# Patient Record
Sex: Female | Born: 1990 | Race: Black or African American | Hispanic: No | Marital: Single | Smoking: Former smoker
Health system: Southern US, Community
[De-identification: ages and names within clinical notes are randomized; demographics above are authoritative.]

## PROBLEM LIST (undated history)

## (undated) ENCOUNTER — Emergency Department (HOSPITAL_BASED_OUTPATIENT_CLINIC_OR_DEPARTMENT_OTHER): Admission: EM | Payer: Medicaid Other | Source: Home / Self Care

## (undated) DIAGNOSIS — R12 Heartburn: Secondary | ICD-10-CM

## (undated) DIAGNOSIS — R42 Dizziness and giddiness: Secondary | ICD-10-CM

## (undated) DIAGNOSIS — R002 Palpitations: Secondary | ICD-10-CM

## (undated) DIAGNOSIS — J45909 Unspecified asthma, uncomplicated: Secondary | ICD-10-CM

## (undated) HISTORY — DX: Heartburn: R12

## (undated) HISTORY — PX: NO PAST SURGERIES: SHX2092

## (undated) HISTORY — DX: Dizziness and giddiness: R42

## (undated) HISTORY — DX: Palpitations: R00.2

---
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
## 2013-06-10 ENCOUNTER — Emergency Department (INDEPENDENT_AMBULATORY_CARE_PROVIDER_SITE_OTHER): Payer: Medicaid Other

## 2013-06-10 ENCOUNTER — Encounter (HOSPITAL_COMMUNITY): Payer: Self-pay | Admitting: Emergency Medicine

## 2013-06-10 ENCOUNTER — Emergency Department (HOSPITAL_COMMUNITY)
Admission: EM | Admit: 2013-06-10 | Discharge: 2013-06-10 | Disposition: A | Discharge status: Home / Self Care | Payer: Medicaid Other | Source: Home / Self Care | Attending: Emergency Medicine | Admitting: Emergency Medicine

## 2013-06-10 DIAGNOSIS — J111 Influenza due to unidentified influenza virus with other respiratory manifestations: Secondary | ICD-10-CM

## 2013-06-10 HISTORY — DX: Unspecified asthma, uncomplicated: J45.909

## 2013-06-10 LAB — POCT RAPID STREP A: Streptococcus, Group A Screen (Direct): NEGATIVE

## 2013-06-10 MED ORDER — IBUPROFEN 800 MG PO TABS
800.0000 mg | ORAL_TABLET | Freq: Once | ORAL | Status: AC
  Administered 2013-06-10: 800 mg via ORAL

## 2013-06-10 MED ORDER — PREDNISONE 20 MG PO TABS: 20.0000 mg | ORAL_TABLET | Freq: Two times a day (BID) | ORAL | Status: DC

## 2013-06-10 MED ORDER — OSELTAMIVIR PHOSPHATE 75 MG PO CAPS: 75.0000 mg | ORAL_CAPSULE | Freq: Two times a day (BID) | ORAL | Status: DC

## 2013-06-10 MED ORDER — TRAMADOL HCL 50 MG PO TABS: 100.0000 mg | ORAL_TABLET | Freq: Three times a day (TID) | ORAL | Status: DC | PRN

## 2013-06-10 MED ORDER — ALBUTEROL SULFATE HFA 108 (90 BASE) MCG/ACT IN AERS: 2.0000 | INHALATION_SPRAY | RESPIRATORY_TRACT | Status: DC | PRN

## 2013-06-10 MED ORDER — IBUPROFEN 800 MG PO TABS
ORAL_TABLET | ORAL | Status: AC
  Filled 2013-06-10: qty 1

## 2013-06-10 NOTE — ED Notes (Signed)
Nasal congestion greenish brown, cough, bloody drainage from nose with nasal congestion, sore throat, chills and sweats but no fever.  Chest hurts when she coughs and also c/o headache in her forehead. Symptoms onset Sunday night.

## 2013-06-10 NOTE — ED Notes (Signed)
Pt. requested something for her headache.  Order obtained from Dr. Lorenz Coaster.

## 2013-06-10 NOTE — ED Provider Notes (Signed)
Chief Complaint   No chief complaint on file.   History of Present Illness   Gail Robinson is a 22 year old female who has had a four-day history of nasal congestion with yellowish, bloody drainage, headache, ear congestion, cough with brown, blood-tinged sputum, chest tightness, wheezing, chest pain when she coughs, chills, sweats, myalgias, and sore throat. She has vomited once. She denies any fever. She has a history of asthma, but does not have an inhaler at home. She was exposed to her boss who had a flulike illness.  Review of Systems   Other than as noted above, the patient denies any of the following symptoms: Systemic:  No fevers, chills, sweats, or myalgias. Eye:  No redness or discharge. ENT:  No ear pain, headache, nasal congestion, drainage, sinus pressure, or sore throat. Neck:  No neck pain, stiffness, or swollen glands. Lungs:  No cough, sputum production, hemoptysis, wheezing, chest tightness, shortness of breath or chest pain. GI:  No abdominal pain, nausea, vomiting or diarrhea.  PMFSH   Past medical history, family history, social history, meds, and allergies were reviewed. She takes birth control pills. She is otherwise healthy.  Physical exam   Vital signs:  BP 150/93  Pulse 78  Temp(Src) 98.7 F (37.1 C) (Oral)  Resp 16  SpO2 97%  LMP 01/15/2013 General:  Alert and oriented.  In no distress.  Skin warm and dry. Eye:  No conjunctival injection or drainage. Lids were normal. ENT:  TMs and canals were normal, without erythema or inflammation.  Nasal mucosa was clear and uncongested, without drainage.  Mucous membranes were moist.  Pharynx was clear with no exudate or drainage.  There were no oral ulcerations or lesions. Neck:  Supple, no adenopathy, tenderness or mass. Lungs:  No respiratory distress.  Lungs were clear to auscultation, without wheezes, rales or rhonchi.  Breath sounds were clear and equal bilaterally.  Heart:  Regular rhythm, without  gallops, murmers or rubs. Skin:  Clear, warm, and dry, without rash or lesions.  Labs   Results for orders placed during the hospital encounter of 06/10/13  POCT RAPID STREP A ([facility] ONLY)      Result Value Range   Streptococcus, Group A Screen (Direct) NEGATIVE  NEGATIVE    Radiology   Dg Chest 2 View  06/10/2013   CLINICAL DATA:  Cough.  Hemoptysis.  Asthma.  EXAM: CHEST  2 VIEW  COMPARISON:  None.  FINDINGS: The heart size and mediastinal contours are within normal limits. Both lungs are clear. Mild pulmonary hyperinflation is demonstrated. Mild thoracic dextroscoliosis also seen.  IMPRESSION: No active cardiopulmonary disease.   Electronically Signed   By: Myles Rosenthal M.D.   On: 06/10/2013 18:31   Assessment     The encounter diagnosis was Influenza-like illness.  She is at risk for a flareup of her asthma, so will treat with albuterol and a short burst of prednisone.  Plan    1.  Meds:  The following meds were prescribed:   New Prescriptions   ALBUTEROL (PROVENTIL HFA;VENTOLIN HFA) 108 (90 BASE) MCG/ACT INHALER    Inhale 2 puffs into the lungs every 4 (four) hours as needed for wheezing or shortness of breath.   OSELTAMIVIR (TAMIFLU) 75 MG CAPSULE    Take 1 capsule (75 mg total) by mouth every 12 (twelve) hours.   PREDNISONE (DELTASONE) 20 MG TABLET    Take 1 tablet (20 mg total) by mouth 2 (two) times daily.   TRAMADOL Janean Sark)  50 MG TABLET    Take 2 tablets (100 mg total) by mouth every 8 (eight) hours as needed.    2.  Patient Education/Counseling:  The patient was given appropriate handouts, self care instructions, and instructed in symptomatic relief.  Instructed to get extra fluids, rest, and use a cool mist vaporizer. Discussed salt and sodium intake and avoidance of decongestants since her blood pressure was mildly elevated today. Suggested she see her primary care physician for recheck on this.  3.  Follow up:  The patient was told to follow up here if no better  in 3 to 4 days, or sooner if becoming worse in any way, and given some red flag symptoms such as increasing fever, difficulty breathing, chest pain, or persistent vomiting which would prompt immediate return.  Follow up here as needed.      Reuben Likes, MD 06/10/13 [phone]

## 2013-06-11 NOTE — L&D Delivery Note (Signed)
Delivery Note At 1:38 AM a viable and healthy female was delivered via Vaginal, Spontaneous Delivery (Presentation: left occiput anterior).  APGAR:8,9; weight- pending. Placenta status:intact - Tomasa BlaseSchultz.  Cord: 3 vessel with the following complications: none.   Anesthesia: Epidural  Episiotomy: none  Lacerations: none   Suture Repair: N/A Est. Blood Loss (mL): 250  Mom and baby doing well, bonding. Aunt at bedside. Mom to postpartum.  Baby to Couplet care / Skin to Skin.  ADAMS,SHNIQUAL SHWON 05/10/2014, 1:55 AM   Delivery attended by me also Agree with note Aviva SignsMarie L Lyriq Finerty, CNM

## 2013-06-12 LAB — CULTURE, GROUP A STREP

## 2013-08-05 ENCOUNTER — Encounter (HOSPITAL_COMMUNITY): Payer: Self-pay | Admitting: Emergency Medicine

## 2013-08-05 ENCOUNTER — Emergency Department (HOSPITAL_COMMUNITY)
Admission: EM | Admit: 2013-08-05 | Discharge: 2013-08-06 | Disposition: A | Discharge status: Home / Self Care | Payer: Medicaid Other | Attending: Emergency Medicine | Admitting: Emergency Medicine

## 2013-08-05 DIAGNOSIS — N898 Other specified noninflammatory disorders of vagina: Secondary | ICD-10-CM | POA: Insufficient documentation

## 2013-08-05 DIAGNOSIS — IMO0002 Reserved for concepts with insufficient information to code with codable children: Secondary | ICD-10-CM | POA: Insufficient documentation

## 2013-08-05 DIAGNOSIS — F17200 Nicotine dependence, unspecified, uncomplicated: Secondary | ICD-10-CM | POA: Insufficient documentation

## 2013-08-05 DIAGNOSIS — Z2089 Contact with and (suspected) exposure to other communicable diseases: Secondary | ICD-10-CM | POA: Insufficient documentation

## 2013-08-05 DIAGNOSIS — Z20828 Contact with and (suspected) exposure to other viral communicable diseases: Secondary | ICD-10-CM

## 2013-08-05 DIAGNOSIS — Z79899 Other long term (current) drug therapy: Secondary | ICD-10-CM | POA: Insufficient documentation

## 2013-08-05 DIAGNOSIS — J45909 Unspecified asthma, uncomplicated: Secondary | ICD-10-CM | POA: Insufficient documentation

## 2013-08-05 NOTE — ED Notes (Signed)
PT wants to be tested for herpes; no symptoms at this time.

## 2013-08-05 NOTE — ED Notes (Signed)
Pt states that she found out today she may have been exposed herpes.

## 2013-08-06 LAB — RPR: RPR Ser Ql: NONREACTIVE

## 2013-08-06 LAB — WET PREP, GENITAL
Trich, Wet Prep: NONE SEEN
Yeast Wet Prep HPF POC: NONE SEEN

## 2013-08-06 LAB — HIV ANTIBODY (ROUTINE TESTING W REFLEX): HIV: NONREACTIVE

## 2013-08-06 NOTE — ED Provider Notes (Signed)
Medical screening examination/treatment/procedure(s) were performed by non-physician practitioner and as supervising physician I was immediately available for consultation/collaboration.  EKG Interpretation   None        Shon Batonourtney F Horton, MD 08/06/13 [phone]

## 2013-08-06 NOTE — ED Provider Notes (Signed)
CSN: [id number]     Arrival date & time 08/05/13  2211 History   First MD Initiated Contact with Patient 08/06/13 0016     Chief Complaint  Patient presents with  . Exposure to STD     (Consider location/radiation/quality/duration/timing/severity/associated sxs/prior Treatment) Patient is a 23 y.o. female presenting with STD exposure.  Exposure to STD   23 yo female presents to ED with request to be tested for Herpes. Patient states her boyfriend had contact with another female that was diagnosed with Herpes about 3 weeks ago. Patient states she last has sex with boyfriend 2 weeks ago. Denies any current symptoms, rash, fever/chills, cough, HA, CP, SOB, vaginal discharge. Dysuria, hematuria, dyspareunia, vaginal bleeding.  Denies any PMH. Patient currently on OCP. Denies any IVDU.   Past Medical History  Diagnosis Date  . Asthma     childhood   History reviewed. No pertinent past surgical history. History reviewed. No pertinent family history. History  Substance Use Topics  . Smoking status: Current Every Day Smoker -- 0.30 packs/day  . Smokeless tobacco: Never Used  . Alcohol Use: No   OB History   Grav Para Term Preterm Abortions TAB SAB Ect Mult Living                 Review of Systems  All other systems reviewed and are negative.      Allergies  Review of patient's allergies indicates no known allergies.  Home Medications   Current Outpatient Rx  Name  Route  Sig  Dispense  Refill  . albuterol (PROVENTIL HFA;VENTOLIN HFA) 108 (90 BASE) MCG/ACT inhaler   Inhalation   Inhale 2 puffs into the lungs every 4 (four) hours as needed for wheezing or shortness of breath.   1 Inhaler   12   . oseltamivir (TAMIFLU) 75 MG capsule   Oral   Take 1 capsule (75 mg total) by mouth every 12 (twelve) hours.   10 capsule   0   . predniSONE (DELTASONE) 20 MG tablet   Oral   Take 1 tablet (20 mg total) by mouth 2 (two) times daily.   10 tablet   0   . traMADol  (ULTRAM) 50 MG tablet   Oral   Take 2 tablets (100 mg total) by mouth every 8 (eight) hours as needed.   30 tablet   0    BP 116/82  Pulse 74  Temp(Src) 98.6 F (37 C) (Oral)  Resp 14  SpO2 100%  LMP 08/04/2013 Physical Exam  Nursing note and vitals reviewed. Constitutional: She is oriented to person, place, and time. She appears well-developed and well-nourished. No distress.  HENT:  Head: Normocephalic and atraumatic.  Nose: Nose normal.  Mouth/Throat: Uvula is midline, oropharynx is clear and moist and mucous membranes are normal. No oral lesions. No oropharyngeal exudate, posterior oropharyngeal edema, posterior oropharyngeal erythema or tonsillar abscesses.  Eyes: Conjunctivae are normal.  Neck: Trachea normal, normal range of motion and full passive range of motion without pain. Neck supple. No JVD present. No tracheal deviation present.  Cardiovascular: Normal rate and regular rhythm.  Exam reveals no gallop and no friction rub.   No murmur heard. Pulmonary/Chest: Effort normal. No respiratory distress. She has no wheezes. She has no rhonchi. She has no rales.  Genitourinary: Vagina normal. Pelvic exam was performed with patient supine. There is no rash, tenderness, lesion or injury on the right labia. There is no rash, tenderness, lesion or injury on the left labia.  Uterus is not enlarged. Cervix exhibits discharge (thin serous light yellowish discharge) and friability. Cervix exhibits no motion tenderness. No erythema, tenderness or bleeding around the vagina. No foreign body around the vagina. No signs of injury around the vagina. No vaginal discharge found.  Musculoskeletal: Normal range of motion. She exhibits no edema.  Lymphadenopathy:       Right: No inguinal adenopathy present.       Left: No inguinal adenopathy present.  Neurological: She is alert and oriented to person, place, and time.  Skin: Skin is warm and dry. She is not diaphoretic.  Psychiatric: She has a  normal mood and affect. Her behavior is normal.    ED Course  Procedures (including critical care time) Labs Review Labs Reviewed  WET PREP, GENITAL - Abnormal; Notable for the following:    Clue Cells Wet Prep HPF POC FEW (*)    WBC, Wet Prep HPF POC MODERATE (*)    All other components within normal limits  GC/CHLAMYDIA PROBE AMP  HSV 2 ANTIBODY, IGG  RPR  HIV ANTIBODY (ROUTINE TESTING)   Imaging Review No results found.  EKG Interpretation   None       MDM   Final diagnoses:  Exposure to herpes    Patient afebrile with normal VS.   Patient asymptomatic. Plan to contact patient with any abnormal culture/test results and treat accordingly. Patient advised to abstain from sexual intercourse until both her and partner get tested and treated appropriately. Patient agrees with plan. Discharged in good condition.      Allen NorrisJacob Gray VerndaleLackey, PA-C 08/06/13 [phone]

## 2013-08-06 NOTE — Discharge Instructions (Signed)
Return to Emergency Department if you develop any new symptoms, rash, skin lesions, fever/chills, vaginal discharge/bleeding, or pain with urination. Avoid sexual intercourse until both you and your partner have been tested and appropriately treated.

## 2013-08-06 NOTE — ED Notes (Signed)
Assisted Cristobal GoldmannJacob Lackey PA with pelvic exam, spec. Collected by PA

## 2013-08-07 LAB — GC/CHLAMYDIA PROBE AMP
CT Probe RNA: POSITIVE — AB
GC Probe RNA: NEGATIVE

## 2013-08-07 LAB — HSV 2 ANTIBODY, IGG: HSV 2 Glycoprotein G Ab, IgG: 0.11 IV

## 2013-08-09 ENCOUNTER — Telehealth (HOSPITAL_COMMUNITY): Payer: Self-pay | Admitting: Emergency Medicine

## 2013-08-09 MED ORDER — DOXYCYCLINE HYCLATE 100 MG PO CAPS: 100.0000 mg | ORAL_CAPSULE | Freq: Two times a day (BID) | ORAL | Status: DC

## 2013-08-09 NOTE — ED Notes (Signed)
Rx for Doxycycline 100 mg PO BID x 7 days [facility] [address] [phone]).

## 2013-08-09 NOTE — ED Notes (Signed)
Chart returned from EDP office. Per Effie ShyWentz MD, give Doxycycline 100 mg PO BID x 7 days.

## 2013-08-09 NOTE — ED Provider Notes (Signed)
I was presented return of labs for this patient today. Her RNA for chlamydia is positive. I have written a prescription for doxycycline and will ask ED staff to get it to her. She needs to be contacted and instructed that she has an STD and she is to refrain from sexual intercourse for 10 days, and have reevaluation with test of cure done at that time.  Flint MelterElliott L Clevland Cork, MD 08/09/13 [phone]

## 2013-11-12 ENCOUNTER — Emergency Department (HOSPITAL_COMMUNITY)
Admission: EM | Admit: 2013-11-12 | Discharge: 2013-11-12 | Disposition: A | Discharge status: Home / Self Care | Payer: Medicaid Other | Source: Home / Self Care | Attending: Emergency Medicine | Admitting: Emergency Medicine

## 2013-11-12 ENCOUNTER — Encounter (HOSPITAL_COMMUNITY): Payer: Self-pay | Admitting: Emergency Medicine

## 2013-11-12 DIAGNOSIS — J45901 Unspecified asthma with (acute) exacerbation: Secondary | ICD-10-CM

## 2013-11-12 DIAGNOSIS — Z3490 Encounter for supervision of normal pregnancy, unspecified, unspecified trimester: Secondary | ICD-10-CM

## 2013-11-12 DIAGNOSIS — G43909 Migraine, unspecified, not intractable, without status migrainosus: Secondary | ICD-10-CM

## 2013-11-12 DIAGNOSIS — J069 Acute upper respiratory infection, unspecified: Secondary | ICD-10-CM

## 2013-11-12 LAB — POCT PREGNANCY, URINE: Preg Test, Ur: POSITIVE — AB

## 2013-11-12 LAB — POCT URINALYSIS DIP (DEVICE)
Bilirubin Urine: NEGATIVE
Glucose, UA: NEGATIVE mg/dL
Hgb urine dipstick: NEGATIVE
Ketones, ur: NEGATIVE mg/dL
Nitrite: NEGATIVE
Protein, ur: NEGATIVE mg/dL
Specific Gravity, Urine: 1.02 (ref 1.005–1.030)
Urobilinogen, UA: 0.2 mg/dL (ref 0.0–1.0)
pH: 7.5 (ref 5.0–8.0)

## 2013-11-12 MED ORDER — IPRATROPIUM-ALBUTEROL 0.5-2.5 (3) MG/3ML IN SOLN
3.0000 mL | Freq: Once | RESPIRATORY_TRACT | Status: AC
  Administered 2013-11-12: 3 mL via RESPIRATORY_TRACT

## 2013-11-12 MED ORDER — IPRATROPIUM BROMIDE 0.02 % IN SOLN
RESPIRATORY_TRACT | Status: AC
  Filled 2013-11-12: qty 2.5

## 2013-11-12 MED ORDER — ALBUTEROL SULFATE (2.5 MG/3ML) 0.083% IN NEBU: 5.0000 mg | INHALATION_SOLUTION | Freq: Once | RESPIRATORY_TRACT | Status: DC

## 2013-11-12 MED ORDER — ALBUTEROL SULFATE (2.5 MG/3ML) 0.083% IN NEBU: 2.5000 mg | INHALATION_SOLUTION | Freq: Four times a day (QID) | RESPIRATORY_TRACT | Status: DC | PRN

## 2013-11-12 MED ORDER — PRENATAL VITAMINS PLUS 27-1 MG PO TABS: ORAL_TABLET | ORAL | Status: DC

## 2013-11-12 MED ORDER — AMOXICILLIN 500 MG PO CAPS: 1000.0000 mg | ORAL_CAPSULE | Freq: Three times a day (TID) | ORAL | Status: DC

## 2013-11-12 MED ORDER — BECLOMETHASONE DIPROPIONATE 80 MCG/ACT IN AERS: 2.0000 | INHALATION_SPRAY | Freq: Two times a day (BID) | RESPIRATORY_TRACT | Status: DC

## 2013-11-12 MED ORDER — ALBUTEROL SULFATE (2.5 MG/3ML) 0.083% IN NEBU
INHALATION_SOLUTION | RESPIRATORY_TRACT | Status: AC
  Filled 2013-11-12: qty 6

## 2013-11-12 MED ORDER — ACETAMINOPHEN 325 MG PO TABS
ORAL_TABLET | ORAL | Status: AC
  Filled 2013-11-12: qty 2

## 2013-11-12 MED ORDER — ALBUTEROL SULFATE (2.5 MG/3ML) 0.083% IN NEBU
2.5000 mg | INHALATION_SOLUTION | Freq: Once | RESPIRATORY_TRACT | Status: AC
  Administered 2013-11-12: 2.5 mg via RESPIRATORY_TRACT

## 2013-11-12 MED ORDER — ACETAMINOPHEN 325 MG PO TABS
650.0000 mg | ORAL_TABLET | Freq: Once | ORAL | Status: AC
  Administered 2013-11-12: 650 mg via ORAL

## 2013-11-12 MED ORDER — ALBUTEROL SULFATE HFA 108 (90 BASE) MCG/ACT IN AERS: 1.0000 | INHALATION_SPRAY | Freq: Four times a day (QID) | RESPIRATORY_TRACT | Status: DC | PRN

## 2013-11-12 NOTE — ED Provider Notes (Signed)
Chief Complaint   Chief Complaint  Patient presents with  . URI    History of Present Illness   Gail Robinson is a 23 year old female who's had a two-day history of cough productive of yellow-brown sputum, wheezing, shortness of breath, chest pain, worsening asthma, and migraine type headache. She's using her inhaler about every 2 hours. She's had chills, subjective fever, and sweats. She's also had sore throat, nasal congestion with yellowish drainage and ear congestion. She's had asthma since childhood and been hospitalized multiple times for prior been on a ventilator. She has migraines about 2-3 times per week and she takes Aleve p.m. Her last menstrual period was about 3 or 4 months ago when she has an Implanad and put her arm. She's had some lower abdominal pain and spotted a couple weeks ago.  Review of Systems   Other than as noted above, the patient denies any of the following symptoms: Systemic:  No fevers, chills, sweats, or myalgias. Eye:  No redness or discharge. ENT:  No ear pain, headache, nasal congestion, drainage, sinus pressure, or sore throat. Neck:  No neck pain, stiffness, or swollen glands. Lungs:  No cough, sputum production, hemoptysis, wheezing, chest tightness, shortness of breath or chest pain. GI:  No abdominal pain, nausea, vomiting or diarrhea.  PMFSH   Past medical history, family history, social history, meds, and allergies were reviewed.   Physical exam   Vital signs:  There were no vitals taken for this visit. General:  Alert and oriented.  In no distress.  Skin warm and dry. Eye:  No conjunctival injection or drainage. Lids were normal. ENT:  TMs and canals were normal, without erythema or inflammation.  Nasal mucosa was clear and uncongested, without drainage.  Mucous membranes were moist.  Pharynx was clear with no exudate or drainage.  There were no oral ulcerations or lesions. Neck:  Supple, no adenopathy, tenderness or mass. Lungs:  No  respiratory distress.  Has bilateral expiratory wheezes, no rales or rhonchi, she has good breath sounds bilaterally.  Heart:  Regular rhythm, without gallops, murmers or rubs. Abdomen: Soft and nontender without organomegaly or mass. Fetal heart tones were detected by Doppler in the right lower quadrant. Skin:  Clear, warm, and dry, without rash or lesions.  Labs   Results for orders placed during the hospital encounter of 11/12/13  POCT PREGNANCY, URINE      Result Value Ref Range   Preg Test, Ur POSITIVE (*) NEGATIVE  POCT URINALYSIS DIP (DEVICE)      Result Value Ref Range   Glucose, UA NEGATIVE  NEGATIVE mg/dL   Bilirubin Urine NEGATIVE  NEGATIVE   Ketones, ur NEGATIVE  NEGATIVE mg/dL   Specific Gravity, Urine 1.020  1.005 - 1.030   Hgb urine dipstick NEGATIVE  NEGATIVE   pH 7.5  5.0 - 8.0   Protein, ur NEGATIVE  NEGATIVE mg/dL   Urobilinogen, UA 0.2  0.0 - 1.0 mg/dL   Nitrite NEGATIVE  NEGATIVE   Leukocytes, UA TRACE (*) NEGATIVE     [facility]   The following medications were given:  Medications  ipratropium-albuterol (DUONEB) 0.5-2.5 (3) MG/3ML nebulizer solution 3 mL (3 mLs Nebulization Given 11/12/13 0933)  acetaminophen (TYLENOL) tablet 650 mg (650 mg Oral Given 11/12/13 0933)  albuterol (PROVENTIL) (2.5 MG/3ML) 0.083% nebulizer solution 2.5 mg (2.5 mg Nebulization Given 11/12/13 0933)   She got complete relief of her wheezing after the ipratropium albuterol. Her lungs were completely clear and  wheeze free.  Assessment     The primary encounter diagnosis was Viral URI. Diagnoses of Asthma attack, Pregnancy, and Migraine headache were also pertinent to this visit.  The biggest issue right now is her pregnancy. She has an Implanad in place and I suggested that she have this removed as soon as possible. She should also seek prenatal care as soon as possible. She was given a prescription for prenatal vitamins. [facility]  p[facility]-related complications such as bleeding or increasing pain and we're limited in what we can give her for her symptoms. And she was given albuterol and Qvar and I suggested Tylenol for the migraine headaches.  Plan    1.  Meds:  The following meds were prescribed:   Discharge Medication List as of 11/12/2013 10:14 AM    START taking these medications   Details  !! albuterol (PROVENTIL HFA;VENTOLIN HFA) 108 (90 BASE) MCG/ACT inhaler Inhale 1-2 puffs into the lungs every 6 (six) hours as needed for wheezing or shortness of breath., Starting 11/12/2013, Until Discontinued, Normal    albuterol (PROVENTIL) (2.5 MG/3ML) 0.083% nebulizer solution Take 3 mLs (2.5 mg total) by nebulization every 6 (six) hours as needed for wheezing., Starting 11/12/2013, Until Discontinued, Normal    amoxicillin (AMOXIL) 500 MG capsule Take 2 capsules (1,000 mg total) by mouth 3 (three) times daily., Starting 11/12/2013, Until Discontinued, Normal    beclomethasone (QVAR) 80 MCG/ACT inhaler Inhale 2 puffs into the lungs 2 (two) times daily., Starting 11/12/2013, Until Discontinued, Normal    Prenatal Vit-Fe Fumarate-FA (PRENATAL VITAMINS PLUS) 27-1 MG TABS Take 1 daily, Normal     !! - Potential duplicate medications found. Please discuss with provider.      2.  Patient Education/Counseling:  The patient was given appropriate handouts, self care instructions, and instructed in symptomatic relief.  Instructed to get extra fluids, rest, and use a cool mist vaporizer.    3.  Follow up:  The patient was told to follow up here if no better in 3 to 4 days, or sooner if becoming worse in any way, and given some red flag symptoms such as increasing fever, difficulty breathing, chest pain, or persistent vomiting which would prompt immediate return.  Follow up here as needed.      Reuben Likesavid C Beacher Every, MD 11/12/13 [phone]

## 2013-11-12 NOTE — ED Notes (Signed)
Pt  Reports  Symptoms      Of  Headache        Cough   /  Congested   Sinus  Drainage                     With  Low  Grade  Fever  X  sev  Days

## 2013-11-12 NOTE — Discharge Instructions (Signed)
May take Claritin for congestion and Tylenol for headache.  Honey, lemon, and tea is OK for cough.  Stop smoking.  Have Implanad removed as soon as possible.   Pregnancy, First Trimester The first trimester is the first 3 months your baby is growing inside you. It is important to follow your doctor's instructions. HOME CARE   Do not smoke.  Do not drink alcohol.  Only take medicine as told by your doctor.  Exercise.  Eat healthy foods. Eat regular, well-balanced meals.  You can have sex (intercourse) if there are no other problems with the pregnancy.  Things that help with morning sickness:  Eat soda crackers before getting up in the morning.  Eat 4 to 5 small meals rather than 3 large meals.  Drink liquids between meals, not during meals.  Go to all appointments as told.  Take all vitamins or supplements as told by your doctor. GET HELP RIGHT AWAY IF:   You develop a fever.  You have a bad smelling fluid that is leaking from your vagina.  There is bleeding from the vagina.  You develop severe belly (abdominal) or back pain.  You throw up (vomit) blood. It may look like coffee grounds.  You lose more than 2 pounds in a week.  You gain 5 pounds or more in a week.  You gain more than 2 pounds in a week and you see puffiness (swelling) in your feet, ankles, or legs.  You have severe dizziness or pass out (faint).  You are around people who have [place] measles, chickenpox, or fifth disease.  You have a headache, watery poop (diarrhea), pain with peeing (urinating), or cannot breath right. Document Released: 11/14/2007 Document Revised: 08/20/2011 Document Reviewed: 11/14/2007 [facility] Patient Information 2014 [place], [place].

## 2013-11-13 ENCOUNTER — Inpatient Hospital Stay (HOSPITAL_COMMUNITY)
Admission: AD | Admit: 2013-11-13 | Discharge: 2013-11-13 | Disposition: A | Discharge status: Home / Self Care | Payer: Medicaid Other | Source: Ambulatory Visit | Attending: Obstetrics & Gynecology | Admitting: Obstetrics & Gynecology

## 2013-11-13 ENCOUNTER — Encounter (HOSPITAL_COMMUNITY): Payer: Self-pay

## 2013-11-13 ENCOUNTER — Inpatient Hospital Stay (HOSPITAL_COMMUNITY): Payer: Medicaid Other

## 2013-11-13 DIAGNOSIS — R102 Pelvic and perineal pain: Secondary | ICD-10-CM

## 2013-11-13 DIAGNOSIS — R109 Unspecified abdominal pain: Secondary | ICD-10-CM | POA: Insufficient documentation

## 2013-11-13 DIAGNOSIS — O26899 Other specified pregnancy related conditions, unspecified trimester: Secondary | ICD-10-CM

## 2013-11-13 DIAGNOSIS — Z87891 Personal history of nicotine dependence: Secondary | ICD-10-CM | POA: Insufficient documentation

## 2013-11-13 DIAGNOSIS — O9989 Other specified diseases and conditions complicating pregnancy, childbirth and the puerperium: Principal | ICD-10-CM

## 2013-11-13 DIAGNOSIS — O99891 Other specified diseases and conditions complicating pregnancy: Secondary | ICD-10-CM | POA: Insufficient documentation

## 2013-11-13 DIAGNOSIS — N949 Unspecified condition associated with female genital organs and menstrual cycle: Secondary | ICD-10-CM | POA: Insufficient documentation

## 2013-11-13 LAB — ABO/RH: ABO/RH(D): B POS

## 2013-11-13 LAB — CBC
HEMATOCRIT: 39.5 % (ref 36.0–46.0)
Hemoglobin: 13.6 g/dL (ref 12.0–15.0)
MCH: 26.1 pg (ref 26.0–34.0)
MCHC: 34.4 g/dL (ref 30.0–36.0)
MCV: 75.8 fL — AB (ref 78.0–100.0)
PLATELETS: 174 10*3/uL (ref 150–400)
RBC: 5.21 MIL/uL — AB (ref 3.87–5.11)
RDW: 14.4 % (ref 11.5–15.5)
WBC: 10.6 10*3/uL — ABNORMAL HIGH (ref 4.0–10.5)

## 2013-11-13 LAB — WET PREP, GENITAL
TRICH WET PREP: NONE SEEN
Yeast Wet Prep HPF POC: NONE SEEN

## 2013-11-13 LAB — HCG, QUANTITATIVE, PREGNANCY: hCG, Beta Chain, Quant, S: 15556 m[IU]/mL — ABNORMAL HIGH (ref <5)

## 2013-11-13 MED ORDER — PROMETHAZINE HCL 25 MG PO TABS: 12.5000 mg | ORAL_TABLET | Freq: Four times a day (QID) | ORAL | Status: DC | PRN

## 2013-11-13 NOTE — Discharge Instructions (Signed)
Nausea medication to take during pregnancy:   Unisom (doxylamine succinate 25 mg tablets) Take one tablet daily at bedtime. If symptoms are not adequately controlled, the dose can be increased to a maximum recommended dose of two tablets daily (1/2 tablet in the morning, 1/2 tablet mid-afternoon and one at bedtime).  Vitamin B6 100mg  tablets. Take one tablet twice a day (up to 200 mg per day).  Round Ligament Pain During Pregnancy Round ligament pain is a sharp pain or jabbing feeling often felt in the lower belly or groin area on one or both sides. It is one of the most common complaints during pregnancy and is considered a normal part of pregnancy. It is most often felt during the second trimester.  Here is what you need to know about round ligament pain, including some tips to help you feel better.  Causes of Round Ligament Pain  Several thick ligaments surround and support your womb (uterus) as it grows during pregnancy. One of them is called the round ligament.  The round ligament connects the front part of the womb to your groin, the area where your legs attach to your pelvis. The round ligament normally tightens and relaxes slowly.  As your baby and womb grow, the round ligament stretches. That makes it more likely to become strained.  Sudden movements can cause the ligament to tighten quickly, like a rubber band snapping. This causes a sudden and quick jabbing feeling.  Symptoms of Round Ligament Pain  Round ligament pain can be concerning and uncomfortable. But it is considered normal as your body changes during pregnancy.  The symptoms of round ligament pain include a sharp, sudden spasm in the belly. It usually affects the right side, but it may happen on both sides. The pain only lasts a few seconds.  Exercise may cause the pain, as will rapid movements such as:  sneezing coughing laughing rolling over in bed standing up too quickly  Treatment of Round Ligament  Pain  Here are some tips that may help reduce your discomfort:  Pain relief. Take over-the-counter acetaminophen for pain, if necessary. Ask your doctor if this is OK.  Exercise. Get plenty of exercise to keep your stomach (core) muscles strong. Doing stretching exercises or prenatal yoga can be helpful. Ask your doctor which exercises are safe for you and your baby.  A helpful exercise involves putting your hands and knees on the floor, lowering your head, and pushing your backside into the air.  Avoid sudden movements. Change positions slowly (such as standing up or sitting down) to avoid sudden movements that may cause stretching and pain.  Flex your hips. Bend and flex your hips before you cough, sneeze, or laugh to avoid pulling on the ligaments.  Apply warmth. A heating pad or warm bath may be helpful. Ask your doctor if this is OK. Extreme heat can be dangerous to the baby.  You should try to modify your daily activity level and avoid positions that may worsen the condition.  When to Call the Doctor/Midwife  Always tell your doctor or midwife about any type of pain you have during pregnancy. Round ligament pain is quick and doesn't last long.  Call your health care provider immediately if you have:  severe pain fever chills pain on urination difficulty walking  Belly pain during pregnancy can be due to many different causes. It is important for your doctor to rule out more serious conditions, including pregnancy complications such as placenta abruption or non-pregnancy illnesses  such as: ° °inguinal hernia °appendicitis °stomach, liver, and kidney problems °Preterm labor pains may sometimes be mistaken for round ligament pain.  °

## 2013-11-13 NOTE — [facility] Note (Signed)
Patient states she had an Implanon placed on 08-27-13. Was seen at [facility] today and had a positive pregnancy test and the implant was removed. Has had abdominal cramping and was sent to [facility] for further evaluation.

## 2013-11-13 NOTE — MAU Provider Note (Signed)
History     CSN: [id number]  Arrival date and time: 11/13/13 1325   First Provider Initiated Contact with Patient 11/13/13 1423         Chief Complaint  Patient presents with  . Abdominal Pain   HPI Gail Robinson is a 23 y.o. [phone] @ unknown gestational age who presents with abdominal pain. Pt had nexplanon placed on 3/19 @ [facility]; negative UPT prior to insertion. LMP prior to insertion was 08/06/13. Went to urgent care yesterday for URI and had a positive UPT. Seen at [facility] today to have nexplanon removed & was sent her to r/u ectopic pregnancy.  Lower abdominal cramping x 2 weeks. Rates pain 8/10. No treatment. Aggravated when pressure applied to abdomen or when she bends over. Had 3 day episode of vaginal spotting 2 weeks ago, no bleeding since. Denies vaginal discharge/dysuria/vaginal irritation.  Reports nausea & vomiting x 1 month. Vomits in the morning & at night, every day. States has lost 24 lbs since March & relates it to the vomiting that she thought was being caused by Nexplanon nexplanon placed 3/19. LMP before placement 2/26. postive UPT at urgent care yesterday. [facility] at [facility].   Denies painful intercourse.  Has had 1 female partner x 1.5 yrs.    Past Medical History  Diagnosis Date  . Asthma     childhood    History reviewed. No pertinent past surgical history.  No family history on file.  History  Substance Use Topics  . Smoking status: Former Smoker -- 0.30 packs/day    Types: Cigarettes  . Smokeless tobacco: Never Used  . Alcohol Use: No    Allergies: No Known Allergies  Prescriptions prior to admission  Medication Sig Dispense Refill  . albuterol (PROVENTIL HFA;VENTOLIN HFA) 108 (90 BASE) MCG/ACT inhaler Inhale 1-2 puffs into the lungs every 6 (six) hours as needed for wheezing or shortness of breath.  1 Inhaler  12  . albuterol (PROVENTIL) (2.5 MG/3ML) 0.083% nebulizer solution Take 3 mLs  (2.5 mg total) by nebulization every 6 (six) hours as needed for wheezing.  75 mL  12  . amoxicillin (AMOXIL) 500 MG capsule Take 2 capsules (1,000 mg total) by mouth 3 (three) times daily.  60 capsule  0  . beclomethasone (QVAR) 80 MCG/ACT inhaler Inhale 2 puffs into the lungs 2 (two) times daily.  1 Inhaler  12  . Prenatal Vit-Fe Fumarate-FA (PRENATAL MULTIVITAMIN) TABS tablet Take 1 tablet by mouth daily at 12 noon.        Review of Systems  Constitutional: Positive for weight loss. Negative for fever and chills.  Respiratory: Positive for cough, sputum production and wheezing. Negative for hemoptysis and shortness of breath.   Cardiovascular: Negative.   Gastrointestinal: Positive for nausea, vomiting and abdominal pain. Negative for diarrhea, constipation and blood in stool.  Genitourinary: Negative.        Negative for vaginal discharge or vaginal bleeding.    Physical Exam   Blood pressure 124/76, pulse 94, temperature 98.6 F (37 C), temperature source Oral, resp. rate 16, height 5\' 7"  (1.702 m), weight 174 lb 12.8 oz (79.289 kg), last menstrual period 08/04/2013, SpO2 99.00%.  Physical Exam  Constitutional: She is oriented to person, place, and time. She appears well-developed and well-nourished. No distress.  Cardiovascular: Normal rate and regular rhythm.   Respiratory: Effort normal. No respiratory distress. She has no wheezes.  GI: Soft. Bowel sounds are normal. She exhibits no distension. There  is tenderness. There is no rebound and no guarding.  Genitourinary: Vagina normal. Uterus is enlarged. Uterus is not tender. Cervix exhibits discharge (moderate amount of thin off-white discharge coming from cervical os). Cervix exhibits no motion tenderness and no friability. Right adnexum displays no mass, no tenderness and no fullness. Left adnexum displays no mass, no tenderness and no fullness.  Cervix closed.   Neurological: She is alert and oriented to person, place, and time.   Skin: Skin is warm and dry.  Psychiatric: She has a normal mood and affect. Her behavior is normal. Judgment and thought content normal.    MAU Course  Procedures Results for orders placed during the hospital encounter of 11/13/13 (from the past 24 hour(s))  CBC     Status: Abnormal   Collection Time    11/13/13  1:30 PM      Result Value Ref Range   WBC 10.6 (*) 4.0 - 10.5 K/uL   RBC 5.21 (*) 3.87 - 5.11 MIL/uL   Hemoglobin 13.6  12.0 - 15.0 g/dL   HCT 16.139.5  09.636.0 - 04.546.0 %   MCV 75.8 (*) 78.0 - 100.0 fL   MCH 26.1  26.0 - 34.0 pg   MCHC 34.4  30.0 - 36.0 g/dL   RDW 40.914.4  81.111.5 - 91.415.5 %   Platelets 174  150 - 400 K/uL  ABO/RH     Status: None   Collection Time    11/13/13  1:30 PM      Result Value Ref Range   ABO/RH(D) B POS    HCG, QUANTITATIVE, PREGNANCY     Status: Abnormal   Collection Time    11/13/13  1:30 PM      Result Value Ref Range   hCG, Beta Chain, Quant, S 15556 (*) <5 mIU/mL  WET PREP, GENITAL     Status: Abnormal   Collection Time    11/13/13  2:42 PM      Result Value Ref Range   Yeast Wet Prep HPF POC NONE SEEN  NONE SEEN   Trich, Wet Prep NONE SEEN  NONE SEEN   Clue Cells Wet Prep HPF POC FEW (*) NONE SEEN   WBC, Wet Prep HPF POC MODERATE (*) NONE SEEN     MDM CBC, beta HCG, GC/chlamydia, wet prep, ultrasound  Assessment and Plan  A: 1. Pelvic pain complicating pregnancy     P: Discharge home in stable condition.  Establish prenatal care as soon as possible.  Rx for phenergan Discussed reasons to return to MAU.   Claudie Reveringrin B Lawrence, Student-NP 11/13/2013 3:43 PM   Claudie ReveringErin B Lawrence 11/13/2013, 3:43 PM   I have seen the patient with the resident/student and agree with the above.  Tawnya CrookHeather Donovan Avraham Benish

## 2013-11-14 LAB — GC/CHLAMYDIA PROBE AMP
CT Probe RNA: NEGATIVE
GC Probe RNA: NEGATIVE

## 2013-12-10 ENCOUNTER — Encounter: Payer: Self-pay | Admitting: Obstetrics & Gynecology

## 2013-12-10 ENCOUNTER — Ambulatory Visit (INDEPENDENT_AMBULATORY_CARE_PROVIDER_SITE_OTHER): Payer: Medicaid Other | Admitting: Obstetrics & Gynecology

## 2013-12-10 ENCOUNTER — Other Ambulatory Visit (HOSPITAL_COMMUNITY)
Admission: RE | Admit: 2013-12-10 | Discharge: 2013-12-10 | Disposition: A | Discharge status: Home / Self Care | Payer: Medicaid Other | Source: Ambulatory Visit | Attending: Obstetrics & Gynecology | Admitting: Obstetrics & Gynecology

## 2013-12-10 VITALS — BP 111/66 | HR 79 | Temp 98.8°F | Wt 184.0 lb

## 2013-12-10 DIAGNOSIS — O0932 Supervision of pregnancy with insufficient antenatal care, second trimester: Secondary | ICD-10-CM

## 2013-12-10 DIAGNOSIS — O99320 Drug use complicating pregnancy, unspecified trimester: Secondary | ICD-10-CM

## 2013-12-10 DIAGNOSIS — Z01419 Encounter for gynecological examination (general) (routine) without abnormal findings: Secondary | ICD-10-CM | POA: Diagnosis present

## 2013-12-10 DIAGNOSIS — O0930 Supervision of pregnancy with insufficient antenatal care, unspecified trimester: Secondary | ICD-10-CM

## 2013-12-10 DIAGNOSIS — F1920 Other psychoactive substance dependence, uncomplicated: Secondary | ICD-10-CM

## 2013-12-10 LAB — POCT URINALYSIS DIP (DEVICE)
Bilirubin Urine: NEGATIVE
Glucose, UA: NEGATIVE mg/dL
Hgb urine dipstick: NEGATIVE
KETONES UR: NEGATIVE mg/dL
Nitrite: NEGATIVE
PH: 5.5 (ref 5.0–8.0)
PROTEIN: NEGATIVE mg/dL
Specific Gravity, Urine: 1.025 (ref 1.005–1.030)
Urobilinogen, UA: 0.2 mg/dL (ref 0.0–1.0)

## 2013-12-10 NOTE — Progress Notes (Signed)
    Subjective:    Gail Robinson is a 23 y.o. G3P1011 at 7036w1d by 15 week scan being seen today for her first obstetrical visit.  Her obstetrical history is significant for having TAB at 19 weeks in previous pregnancy due to being told "baby's intestines were outside body". Other pregnancy was normal, had term SVD. Both pregnancies were in [place].  For this pregnancy, [facility] around the time she conceived; reported having unprotected IC the night after placement with no backup contraception.  Nexplanon ended up being removed around [redacted] weeks GA.  Patient does intend to breast feed. Pregnancy history fully reviewed.  Patient reports no complaints.  Interested in water birth.  Filed Vitals:   12/10/13 0924  BP: 111/66  Pulse: 79  Temp: 98.8 F (37.1 C)  Weight: 184 lb (83.462 kg)    HISTORY: OB History  Gravida Para Term Preterm AB SAB TAB Ectopic Multiple Living  3 1 1  1  1   1     # Outcome Date GA Lbr Len/2nd Weight Sex Delivery Anes PTL Lv  3 CUR           2 TAB 2013 2271w0d            Comments: terminated pregnancy due to informed by providers that "baby intestines outside of body would be 50/50 chance of surival"  Provider encourged pt to have abortion  1 TRM 01/03/10 6167w0d  8 lb 7 oz (3.827 kg) M SVD EPI  Y     Past Medical History  Diagnosis Date  . Asthma     childhood   History reviewed. No pertinent past surgical history. History reviewed. No pertinent family history.   Exam    Uterus:  Fundal Height: 19 cm  Pelvic Exam:    Perineum: No Hemorrhoids, Normal Perineum   Vulva: normal   Vagina:  normal mucosa, normal discharge   Cervix: multiparous appearance, small amount of bleeding following Pap and no cervical motion tenderness   Adnexa: normal adnexa and no mass, fullness, tenderness   Bony Pelvis: gynecoid  System: Breast:  normal appearance, no masses or tenderness   Skin: normal coloration and turgor, no rashes   Neurologic: oriented, normal   Extremities: normal strength, tone, and muscle mass   HEENT PERRLA and extra ocular movement intact   Mouth/Teeth mucous membranes moist, pharynx normal without lesions and dental hygiene good   Neck supple and no masses   Cardiovascular: regular rate and rhythm   Respiratory:  appears well, vitals normal, no respiratory distress, acyanotic, normal RR, chest clear, no wheezing, crepitations, rhonchi, normal symmetric air entry   Abdomen: soft, non-tender; bowel sounds normal; no masses,  no organomegaly   Urinary: urethral meatus normal      Assessment:    Pregnancy: [id number] Patient Active Problem List   Diagnosis Date Noted  . Late prenatal care starting at 19 weeks 12/10/2013    Plan:   Initial labs drawn. Continue prenatal vitamins. Problem list reviewed and updated. Genetic Screening discussed Quad Screen: ordered. Ultrasound discussed; fetal survey: ordered. [facility] in [facility] weeks [facility], will discuss waterbirth at that visit.    Jaynie CollinsUGONNA  Camreigh Michie, MD, FACOG Attending Obstetrician & Gynecologist Faculty Practice, [facility] [place]

## 2013-12-10 NOTE — Patient Instructions (Addendum)
Second Trimester of Pregnancy The second trimester is from week 13 through week 28, months 4 through 6. The second trimester is often a time when you feel your best. Your body has also adjusted to being pregnant, and you begin to feel better physically. Usually, morning sickness has lessened or quit completely, you may have more energy, and you may have an increase in appetite. The second trimester is also a time when the fetus is growing rapidly. At the end of the sixth month, the fetus is about 9 inches long and weighs about 1 pounds. You will likely begin to feel the baby move (quickening) between 18 and 20 weeks of the pregnancy. BODY CHANGES Your body goes through many changes during pregnancy. The changes vary from woman to woman.   Your weight will continue to increase. You will notice your lower abdomen bulging out.  You may begin to get stretch marks on your hips, abdomen, and breasts.  You may develop headaches that can be relieved by medicines approved by your health care provider.  You may urinate more often because the fetus is pressing on your bladder.  You may develop or continue to have heartburn as a result of your pregnancy.  You may develop constipation because certain hormones are causing the muscles that push waste through your intestines to slow down.  You may develop hemorrhoids or swollen, bulging veins (varicose veins).  You may have back pain because of the weight gain and pregnancy hormones relaxing your joints between the bones in your pelvis and as a result of a shift in weight and the muscles that support your balance.  Your breasts will continue to grow and be tender.  Your gums may bleed and may be sensitive to brushing and flossing.  Dark spots or blotches (chloasma, mask of pregnancy) may develop on your face. This will likely fade after the baby is born.  A dark line from your belly button to the pubic area (linea nigra) may appear. This will likely  fade after the baby is born.  You may have changes in your hair. These can include thickening of your hair, rapid growth, and changes in texture. Some women also have hair loss during or after pregnancy, or hair that feels dry or thin. Your hair will most likely return to normal after your baby is born. WHAT TO EXPECT AT YOUR PRENATAL VISITS During a routine prenatal visit:  You will be weighed to make sure you and the fetus are growing normally.  Your blood pressure will be taken.  Your abdomen will be measured to track your baby's growth.  The fetal heartbeat will be listened to.  Any test results from the previous visit will be discussed. Your health care provider may ask you:  How you are feeling.  If you are feeling the baby move.  If you have had any abnormal symptoms, such as leaking fluid, bleeding, severe headaches, or abdominal cramping.  If you have any questions. Other tests that may be performed during your second trimester include:  Blood tests that check for:  Low iron levels (anemia).  Gestational diabetes (between 24 and 28 weeks).  Rh antibodies.  Urine tests to check for infections, diabetes, or protein in the urine.  An ultrasound to confirm the proper growth and development of the baby.  An amniocentesis to check for possible genetic problems.  Fetal screens for spina bifida and Down syndrome. HOME CARE INSTRUCTIONS   Avoid all smoking, herbs, alcohol, and unprescribed   drugs. These chemicals affect the formation and growth of the baby.  Follow your health care provider's instructions regarding medicine use. There are medicines that are either safe or unsafe to take during pregnancy.  Exercise only as directed by your health care provider. Experiencing uterine cramps is a good sign to stop exercising.  Continue to eat regular, healthy meals.  Wear a good support bra for breast tenderness.  Do not use hot tubs, steam rooms, or saunas.  Wear  your seat belt at all times when driving.  Avoid raw meat, uncooked cheese, cat litter boxes, and soil used by cats. These carry germs that can cause birth defects in the baby.  Take your prenatal vitamins.  Try taking a stool softener (if your health care provider approves) if you develop constipation. Eat more high-fiber foods, such as fresh vegetables or fruit and whole grains. Drink plenty of fluids to keep your urine clear or pale yellow.  Take warm sitz baths to soothe any pain or discomfort caused by hemorrhoids. Use hemorrhoid cream if your health care provider approves.  If you develop varicose veins, wear support hose. Elevate your feet for 15 minutes, 3-4 times a day. Limit salt in your diet.  Avoid heavy lifting, wear low heel shoes, and practice good posture.  Rest with your legs elevated if you have leg cramps or low back pain.  Visit your dentist if you have not gone yet during your pregnancy. Use a soft toothbrush to brush your teeth and be gentle when you floss.  A sexual relationship may be continued unless your health care provider directs you otherwise.  Continue to go to all your prenatal visits as directed by your health care provider. SEEK MEDICAL CARE IF:   You have dizziness.  You have mild pelvic cramps, pelvic pressure, or nagging pain in the abdominal area.  You have persistent nausea, vomiting, or diarrhea.  You have a bad smelling vaginal discharge.  You have pain with urination. SEEK IMMEDIATE MEDICAL CARE IF:   You have a fever.  You are leaking fluid from your vagina.  You have spotting or bleeding from your vagina.  You have severe abdominal cramping or pain.  You have rapid weight gain or loss.  You have shortness of breath with chest pain.  You notice sudden or extreme swelling of your face, hands, ankles, feet, or legs.  You have not felt your baby move in over an hour.  You have severe headaches that do not go away with  medicine.  You have vision changes. Document Released: 05/22/2001 Document Revised: 06/02/2013 Document Reviewed: 07/29/2012 [employer] Patient Information 2015 [employer]. This information is not intended to replace advice given to you by your health care provider. Make sure you discuss any questions you have with your health care provider.  Breastfeeding Deciding to breastfeed is one of the best choices you can make for you and your baby. A change in hormones during pregnancy causes your breast tissue to grow and increases the number and size of your milk ducts. These hormones also allow proteins, sugars, and fats from your blood supply to make breast milk in your milk-producing glands. Hormones prevent breast milk from being released before your baby is born as well as prompt milk flow after birth. Once breastfeeding has begun, thoughts of your baby, as well as his or her sucking or crying, can stimulate the release of milk from your milk-producing glands.  BENEFITS OF BREASTFEEDING For Your Baby  Your first   milk (colostrum) helps your baby's digestive system function better.   There are antibodies in your milk that help your baby fight off infections.   Your baby has a lower incidence of asthma, allergies, and sudden infant death syndrome.   The nutrients in breast milk are better for your baby than infant formulas and are designed uniquely for your baby's needs.   Breast milk improves your baby's brain development.   Your baby is less likely to develop other conditions, such as childhood obesity, asthma, or type 2 diabetes mellitus.  For You   Breastfeeding helps to create a very special bond between you and your baby.   Breastfeeding is convenient. Breast milk is always available at the correct temperature and costs nothing.   Breastfeeding helps to burn calories and helps you lose the weight gained during pregnancy.   Breastfeeding makes your uterus contract to its  prepregnancy size faster and slows bleeding (lochia) after you give birth.   Breastfeeding helps to lower your risk of developing type 2 diabetes mellitus, osteoporosis, and breast or ovarian cancer later in life. SIGNS THAT YOUR BABY IS HUNGRY Early Signs of Hunger  Increased alertness or activity.  Stretching.  Movement of the head from side to side.  Movement of the head and opening of the mouth when the corner of the mouth or cheek is stroked (rooting).  Increased sucking sounds, smacking lips, cooing, sighing, or squeaking.  Hand-to-mouth movements.  Increased sucking of fingers or hands. Late Signs of Hunger  Fussing.  Intermittent crying. Extreme Signs of Hunger Signs of extreme hunger will require calming and consoling before your baby will be able to breastfeed successfully. Do not wait for the following signs of extreme hunger to occur before you initiate breastfeeding:   Restlessness.  A loud, strong cry.   Screaming. BREASTFEEDING BASICS Breastfeeding Initiation  Find a comfortable place to sit or lie down, with your neck and back well supported.  Place a pillow or rolled up blanket under your baby to bring him or her to the level of your breast (if you are seated). Nursing pillows are specially designed to help support your arms and your baby while you breastfeed.  Make sure that your baby's abdomen is facing your abdomen.   Gently massage your breast. With your fingertips, massage from your chest wall toward your nipple in a circular motion. This encourages milk flow. You may need to continue this action during the feeding if your milk flows slowly.  Support your breast with 4 fingers underneath and your thumb above your nipple. Make sure your fingers are well away from your nipple and your baby's mouth.   Stroke your baby's lips gently with your finger or nipple.   When your baby's mouth is open wide enough, quickly bring your baby to your breast,  placing your entire nipple and as much of the colored area around your nipple (areola) as possible into your baby's mouth.   More areola should be visible above your baby's upper lip than below the lower lip.   Your baby's tongue should be between his or her lower gum and your breast.   Ensure that your baby's mouth is correctly positioned around your nipple (latched). Your baby's lips should create a seal on your breast and be turned out (everted).  It is common for your baby to suck about 2-3 minutes in order to start the flow of breast milk. Latching Teaching your baby how to latch on to your breast   properly is very important. An improper latch can cause nipple pain and decreased milk supply for you and poor weight gain in your baby. Also, if your baby is not latched onto your nipple properly, he or she may swallow some air during feeding. This can make your baby fussy. Burping your baby when you switch breasts during the feeding can help to get rid of the air. However, teaching your baby to latch on properly is still the best way to prevent fussiness from swallowing air while breastfeeding. Signs that your baby has successfully latched on to your nipple:    Silent tugging or silent sucking, without causing you pain.   Swallowing heard between every 3-4 sucks.    Muscle movement above and in front of his or her ears while sucking.  Signs that your baby has not successfully latched on to nipple:   Sucking sounds or smacking sounds from your baby while breastfeeding.  Nipple pain. If you think your baby has not latched on correctly, slip your finger into the corner of your baby's mouth to break the suction and place it between your baby's gums. Attempt breastfeeding initiation again. Signs of Successful Breastfeeding Signs from your baby:   A gradual decrease in the number of sucks or complete cessation of sucking.   Falling asleep.   Relaxation of his or her body.    Retention of a small amount of milk in his or her mouth.   Letting go of your breast by himself or herself. Signs from you:  Breasts that have increased in firmness, weight, and size 1-3 hours after feeding.   Breasts that are softer immediately after breastfeeding.  Increased milk volume, as well as a change in milk consistency and color by the fifth day of breastfeeding.   Nipples that are not sore, cracked, or bleeding. Signs That Your Baby is Getting Enough Milk  Wetting at least 3 diapers in a 24-hour period. The urine should be clear and pale yellow by age 5 days.  At least 3 stools in a 24-hour period by age 5 days. The stool should be soft and yellow.  At least 3 stools in a 24-hour period by age 7 days. The stool should be seedy and yellow.  No loss of weight greater than 10% of birth weight during the first 3 days of age.  Average weight gain of 4-7 ounces (113-198 g) per week after age 4 days.  Consistent daily weight gain by age 5 days, without weight loss after the age of 2 weeks. After a feeding, your baby may spit up a small amount. This is common. BREASTFEEDING FREQUENCY AND DURATION Frequent feeding will help you make more milk and can prevent sore nipples and breast engorgement. Breastfeed when you feel the need to reduce the fullness of your breasts or when your baby shows signs of hunger. This is called "breastfeeding on demand." Avoid introducing a pacifier to your baby while you are working to establish breastfeeding (the first 4-6 weeks after your baby is born). After this time you may choose to use a pacifier. Research has shown that pacifier use during the first year of a baby's life decreases the risk of sudden infant death syndrome (SIDS). Allow your baby to feed on each breast as long as he or she wants. Breastfeed until your baby is finished feeding. When your baby unlatches or falls asleep while feeding from the first breast, offer the second breast.  Because newborns are often sleepy in the   first few weeks of life, you may need to awaken your baby to get him or her to feed. Breastfeeding times will vary from baby to baby. However, the following rules can serve as a guide to help you ensure that your baby is properly fed:  Newborns (babies 4 weeks of age or younger) may breastfeed every 1-3 hours.  Newborns should not go longer than 3 hours during the day or 5 hours during the night without breastfeeding.  You should breastfeed your baby a minimum of 8 times in a 24-hour period until you begin to introduce solid foods to your baby at around 6 months of age. BREAST MILK PUMPING Pumping and storing breast milk allows you to ensure that your baby is exclusively fed your breast milk, even at times when you are unable to breastfeed. This is especially important if you are going back to work while you are still breastfeeding or when you are not able to be present during feedings. Your lactation consultant can give you guidelines on how long it is safe to store breast milk.  A breast pump is a machine that allows you to pump milk from your breast into a sterile bottle. The pumped breast milk can then be stored in a refrigerator or freezer. Some breast pumps are operated by hand, while others use electricity. Ask your lactation consultant which type will work best for you. Breast pumps can be purchased, but some hospitals and breastfeeding support groups lease breast pumps on a monthly basis. A lactation consultant can teach you how to hand express breast milk, if you prefer not to use a pump.  CARING FOR YOUR BREASTS WHILE YOU BREASTFEED Nipples can become dry, cracked, and sore while breastfeeding. The following recommendations can help keep your breasts moisturized and healthy:  Avoid using soap on your nipples.   Wear a supportive bra. Although not required, special nursing bras and tank tops are designed to allow access to your breasts for  breastfeeding without taking off your entire bra or top. Avoid wearing underwire-style bras or extremely tight bras.  Air dry your nipples for 3-4minutes after each feeding.   Use only cotton bra pads to absorb leaked breast milk. Leaking of breast milk between feedings is normal.   Use lanolin on your nipples after breastfeeding. Lanolin helps to maintain your skin's normal moisture barrier. If you use pure lanolin, you do not need to wash it off before feeding your baby again. Pure lanolin is not toxic to your baby. You may also hand express a few drops of breast milk and gently massage that milk into your nipples and allow the milk to air dry. In the first few weeks after giving birth, some women experience extremely full breasts (engorgement). Engorgement can make your breasts feel heavy, warm, and tender to the touch. Engorgement peaks within 3-5 days after you give birth. The following recommendations can help ease engorgement:  Completely empty your breasts while breastfeeding or pumping. You may want to start by applying warm, moist heat (in the shower or with warm water-soaked hand towels) just before feeding or pumping. This increases circulation and helps the milk flow. If your baby does not completely empty your breasts while breastfeeding, pump any extra milk after he or she is finished.  Wear a snug bra (nursing or regular) or tank top for 1-2 days to signal your body to slightly decrease milk production.  Apply ice packs to your breasts, unless this is too uncomfortable for you.    Make sure that your baby is latched on and positioned properly while breastfeeding. If engorgement persists after 48 hours of following these recommendations, contact your health care provider or a lactation consultant. OVERALL HEALTH CARE RECOMMENDATIONS WHILE BREASTFEEDING  Eat healthy foods. Alternate between meals and snacks, eating 3 of each per day. Because what you eat affects your breast milk,  some of the foods may make your baby more irritable than usual. Avoid eating these foods if you are sure that they are negatively affecting your baby.  Drink milk, fruit juice, and water to satisfy your thirst (about 10 glasses a day).   Rest often, relax, and continue to take your prenatal vitamins to prevent fatigue, stress, and anemia.  Continue breast self-awareness checks.  Avoid chewing and smoking tobacco.  Avoid alcohol and drug use. Some medicines that may be harmful to your baby can pass through breast milk. It is important to ask your health care provider before taking any medicine, including all over-the-counter and prescription medicine as well as vitamin and herbal supplements. It is possible to become pregnant while breastfeeding. If birth control is desired, ask your health care provider about options that will be safe for your baby. SEEK MEDICAL CARE IF:   You feel like you want to stop breastfeeding or have become frustrated with breastfeeding.  You have painful breasts or nipples.  Your nipples are cracked or bleeding.  Your breasts are red, tender, or warm.  You have a swollen area on either breast.  You have a fever or chills.  You have nausea or vomiting.  You have drainage other than breast milk from your nipples.  Your breasts do not become full before feedings by the fifth day after you give birth.  You feel sad and depressed.  Your baby is too sleepy to eat well.  Your baby is having trouble sleeping.   Your baby is wetting less than 3 diapers in a 24-hour period.  Your baby has less than 3 stools in a 24-hour period.  Your baby's skin or the white part of his or her eyes becomes yellow.   Your baby is not gaining weight by 5 days of age. SEEK IMMEDIATE MEDICAL CARE IF:   Your baby is overly tired (lethargic) and does not want to wake up and feed.  Your baby develops an unexplained fever. Document Released: 05/28/2005 Document Revised:  06/02/2013 Document Reviewed: 11/19/2012 [employer] Patient Information 2015 [employer]. This information is not intended to replace advice given to you by your health care provider. Make sure you discuss any questions you have with your health care provider.  

## 2013-12-10 NOTE — Progress Notes (Signed)
New ob packet given Weight gain 25-35lb

## 2013-12-11 LAB — OBSTETRIC PANEL
ANTIBODY SCREEN: NEGATIVE
BASOS ABS: 0 10*3/uL (ref 0.0–0.1)
Basophils Relative: 0 % (ref 0–1)
Eosinophils Absolute: 0.3 10*3/uL (ref 0.0–0.7)
Eosinophils Relative: 3 % (ref 0–5)
HEMATOCRIT: 41.9 % (ref 36.0–46.0)
Hemoglobin: 13.9 g/dL (ref 12.0–15.0)
Hepatitis B Surface Ag: NEGATIVE
LYMPHS PCT: 18 % (ref 12–46)
Lymphs Abs: 1.8 10*3/uL (ref 0.7–4.0)
MCH: 25.4 pg — ABNORMAL LOW (ref 26.0–34.0)
MCHC: 33.2 g/dL (ref 30.0–36.0)
MCV: 76.5 fL — ABNORMAL LOW (ref 78.0–100.0)
MONO ABS: 0.7 10*3/uL (ref 0.1–1.0)
Monocytes Relative: 7 % (ref 3–12)
NEUTROS ABS: 7.3 10*3/uL (ref 1.7–7.7)
NEUTROS PCT: 72 % (ref 43–77)
Platelets: 201 10*3/uL (ref 150–400)
RBC: 5.48 MIL/uL — ABNORMAL HIGH (ref 3.87–5.11)
RDW: 15.7 % — AB (ref 11.5–15.5)
Rh Type: POSITIVE
Rubella: 5.51 Index — ABNORMAL HIGH (ref <0.90)
WBC: 10.2 10*3/uL (ref 4.0–10.5)

## 2013-12-11 LAB — HIV ANTIBODY (ROUTINE TESTING W REFLEX): HIV 1&2 Ab, 4th Generation: NONREACTIVE

## 2013-12-13 LAB — CULTURE, OB URINE

## 2013-12-14 LAB — AFP, QUAD SCREEN
AFP: 35.3 IU/mL
Age Alone: 1:1090 {titer}
Curr Gest Age: 19.1 wks.days
HCG, Total: 13740 m[IU]/mL
INH: 344.1 pg/mL
Interpretation-AFP: NEGATIVE
MOM FOR HCG: 0.95
MOM FOR INH: 2.14
MoM for AFP: 0.8
Open Spina bifida: NEGATIVE
TRI 18 SCR RISK EST: NEGATIVE
Trisomy 18 (Edward) Syndrome Interp.: 1:43300 {titer}
UE3 MOM: 0.87
uE3 Value: 0.9 ng/mL

## 2013-12-14 LAB — CYTOLOGY - PAP

## 2013-12-14 LAB — CANNABANOIDS (GC/LC/MS), URINE: THC-COOH (GC/LC/MS), ur confirm: 364 ng/mL — AB (ref <5)

## 2013-12-15 ENCOUNTER — Encounter: Payer: Self-pay | Admitting: Obstetrics & Gynecology

## 2013-12-15 LAB — PRESCRIPTION MONITORING PROFILE (19 PANEL)
AMPHETAMINE/METH: NEGATIVE ng/mL
BUPRENORPHINE, URINE: NEGATIVE ng/mL
Barbiturate Screen, Urine: NEGATIVE ng/mL
Benzodiazepine Screen, Urine: NEGATIVE ng/mL
CARISOPRODOL, URINE: NEGATIVE ng/mL
CREATININE, URINE: 199.31 mg/dL (ref >20.0)
Cocaine Metabolites: NEGATIVE ng/mL
ECSTASY: NEGATIVE ng/mL
Fentanyl, Ur: NEGATIVE ng/mL
MEPERIDINE UR: NEGATIVE ng/mL
METHADONE SCREEN, URINE: NEGATIVE ng/mL
METHAQUALONE SCREEN (URINE): NEGATIVE ng/mL
NITRITES URINE, INITIAL: NEGATIVE ug/mL
Opiate Screen, Urine: NEGATIVE ng/mL
Oxycodone Screen, Ur: NEGATIVE ng/mL
PROPOXYPHENE: NEGATIVE ng/mL
Phencyclidine, Ur: NEGATIVE ng/mL
TAPENTADOLUR: NEGATIVE ng/mL
Tramadol Scrn, Ur: NEGATIVE ng/mL
Zolpidem, Urine: NEGATIVE ng/mL
pH, Initial: 5.8 pH (ref 4.5–8.9)

## 2013-12-15 LAB — HEMOGLOBINOPATHY EVALUATION
HEMOGLOBIN OTHER: 0 %
HGB A: 97.8 % (ref 96.8–97.8)
Hgb A2 Quant: 2.2 % (ref 2.2–3.2)
Hgb F Quant: 0 % (ref 0.0–2.0)
Hgb S Quant: 0 %

## 2014-01-06 ENCOUNTER — Ambulatory Visit (INDEPENDENT_AMBULATORY_CARE_PROVIDER_SITE_OTHER): Payer: Medicaid Other | Admitting: Obstetrics & Gynecology

## 2014-01-06 VITALS — BP 102/57 | HR 91 | Temp 98.6°F | Wt 184.8 lb

## 2014-01-06 DIAGNOSIS — O0932 Supervision of pregnancy with insufficient antenatal care, second trimester: Secondary | ICD-10-CM

## 2014-01-06 DIAGNOSIS — O0930 Supervision of pregnancy with insufficient antenatal care, unspecified trimester: Secondary | ICD-10-CM

## 2014-01-06 LAB — POCT URINALYSIS DIP (DEVICE)
Bilirubin Urine: NEGATIVE
Glucose, UA: NEGATIVE mg/dL
HGB URINE DIPSTICK: NEGATIVE
Ketones, ur: NEGATIVE mg/dL
Leukocytes, UA: NEGATIVE
Nitrite: NEGATIVE
PROTEIN: NEGATIVE mg/dL
Specific Gravity, Urine: 1.015 (ref 1.005–1.030)
UROBILINOGEN UA: 0.2 mg/dL (ref 0.0–1.0)
pH: 7.5 (ref 5.0–8.0)

## 2014-01-06 NOTE — Patient Instructions (Signed)
Second Trimester of Pregnancy The second trimester is from week 13 through week 28, months 4 through 6. The second trimester is often a time when you feel your best. Your body has also adjusted to being pregnant, and you begin to feel better physically. Usually, morning sickness has lessened or quit completely, you may have more energy, and you may have an increase in appetite. The second trimester is also a time when the fetus is growing rapidly. At the end of the sixth month, the fetus is about 9 inches long and weighs about 1 pounds. You will likely begin to feel the baby move (quickening) between 18 and 20 weeks of the pregnancy. BODY CHANGES Your body goes through many changes during pregnancy. The changes vary from woman to woman.   Your weight will continue to increase. You will notice your lower abdomen bulging out.  You may begin to get stretch marks on your hips, abdomen, and breasts.  You may develop headaches that can be relieved by medicines approved by your health care provider.  You may urinate more often because the fetus is pressing on your bladder.  You may develop or continue to have heartburn as a result of your pregnancy.  You may develop constipation because certain hormones are causing the muscles that push waste through your intestines to slow down.  You may develop hemorrhoids or swollen, bulging veins (varicose veins).  You may have back pain because of the weight gain and pregnancy hormones relaxing your joints between the bones in your pelvis and as a result of a shift in weight and the muscles that support your balance.  Your breasts will continue to grow and be tender.  Your gums may bleed and may be sensitive to brushing and flossing.  Dark spots or blotches (chloasma, mask of pregnancy) may develop on your face. This will likely fade after the baby is born.  A dark line from your belly button to the pubic area (linea nigra) may appear. This will likely fade  after the baby is born.  You may have changes in your hair. These can include thickening of your hair, rapid growth, and changes in texture. Some women also have hair loss during or after pregnancy, or hair that feels dry or thin. Your hair will most likely return to normal after your baby is born. WHAT TO EXPECT AT YOUR PRENATAL VISITS During a routine prenatal visit:  You will be weighed to make sure you and the fetus are growing normally.  Your blood pressure will be taken.  Your abdomen will be measured to track your baby's growth.  The fetal heartbeat will be listened to.  Any test results from the previous visit will be discussed. Your health care provider may ask you:  How you are feeling.  If you are feeling the baby move.  If you have had any abnormal symptoms, such as leaking fluid, bleeding, severe headaches, or abdominal cramping.  If you have any questions. Other tests that may be performed during your second trimester include:  Blood tests that check for:  Low iron levels (anemia).  Gestational diabetes (between 24 and 28 weeks).  Rh antibodies.  Urine tests to check for infections, diabetes, or protein in the urine.  An ultrasound to confirm the proper growth and development of the baby.  An amniocentesis to check for possible genetic problems.  Fetal screens for spina bifida and Down syndrome. HOME CARE INSTRUCTIONS   Avoid all smoking, herbs, alcohol, and unprescribed   drugs. These chemicals affect the formation and growth of the baby.  Follow your health care provider's instructions regarding medicine use. There are medicines that are either safe or unsafe to take during pregnancy.  Exercise only as directed by your health care provider. Experiencing uterine cramps is a good sign to stop exercising.  Continue to eat regular, healthy meals.  Wear a good support bra for breast tenderness.  Do not use hot tubs, steam rooms, or saunas.  Wear your  seat belt at all times when driving.  Avoid raw meat, uncooked cheese, cat litter boxes, and soil used by cats. These carry germs that can cause birth defects in the baby.  Take your prenatal vitamins.  Try taking a stool softener (if your health care provider approves) if you develop constipation. Eat more high-fiber foods, such as fresh vegetables or fruit and whole grains. Drink plenty of fluids to keep your urine clear or pale yellow.  Take warm sitz baths to soothe any pain or discomfort caused by hemorrhoids. Use hemorrhoid cream if your health care provider approves.  If you develop varicose veins, wear support hose. Elevate your feet for 15 minutes, 3-4 times a day. Limit salt in your diet.  Avoid heavy lifting, wear low heel shoes, and practice good posture.  Rest with your legs elevated if you have leg cramps or low back pain.  Visit your dentist if you have not gone yet during your pregnancy. Use a soft toothbrush to brush your teeth and be gentle when you floss.  A sexual relationship may be continued unless your health care provider directs you otherwise.  Continue to go to all your prenatal visits as directed by your health care provider. SEEK MEDICAL CARE IF:   You have dizziness.  You have mild pelvic cramps, pelvic pressure, or nagging pain in the abdominal area.  You have persistent nausea, vomiting, or diarrhea.  You have a bad smelling vaginal discharge.  You have pain with urination. SEEK IMMEDIATE MEDICAL CARE IF:   You have a fever.  You are leaking fluid from your vagina.  You have spotting or bleeding from your vagina.  You have severe abdominal cramping or pain.  You have rapid weight gain or loss.  You have shortness of breath with chest pain.  You notice sudden or extreme swelling of your face, hands, ankles, feet, or legs.  You have not felt your baby move in over an hour.  You have severe headaches that do not go away with  medicine.  You have vision changes. Document Released: 05/22/2001 Document Revised: 06/02/2013 Document Reviewed: 07/29/2012 [employer] Patient Information 2015 [employer]. This information is not intended to replace advice given to you by your health care provider. Make sure you discuss any questions you have with your health care provider.  

## 2014-01-06 NOTE — Progress Notes (Signed)
Needs detailed [place]. Discussed discomforts of pregnancy.

## 2014-01-06 NOTE — Progress Notes (Signed)
Pt is experiencing pelvic pain  And hears crunching noise of bones rubbing together.   Pt reports passing out this week.

## 2014-01-11 ENCOUNTER — Ambulatory Visit (HOSPITAL_COMMUNITY)
Admission: RE | Admit: 2014-01-11 | Discharge: 2014-01-11 | Disposition: A | Discharge status: Home / Self Care | Payer: Medicaid Other | Source: Ambulatory Visit | Attending: Obstetrics & Gynecology | Admitting: Obstetrics & Gynecology

## 2014-01-11 DIAGNOSIS — Z3689 Encounter for other specified antenatal screening: Secondary | ICD-10-CM | POA: Insufficient documentation

## 2014-01-11 DIAGNOSIS — O0930 Supervision of pregnancy with insufficient antenatal care, unspecified trimester: Secondary | ICD-10-CM | POA: Diagnosis not present

## 2014-01-11 DIAGNOSIS — O0932 Supervision of pregnancy with insufficient antenatal care, second trimester: Secondary | ICD-10-CM

## 2014-02-03 ENCOUNTER — Ambulatory Visit (INDEPENDENT_AMBULATORY_CARE_PROVIDER_SITE_OTHER): Payer: Medicaid Other | Admitting: Obstetrics and Gynecology

## 2014-02-03 DIAGNOSIS — Z3480 Encounter for supervision of other normal pregnancy, unspecified trimester: Secondary | ICD-10-CM

## 2014-02-03 NOTE — Progress Notes (Signed)
Chart review: [place] normal.  Pt no show for encounter

## 2014-02-24 ENCOUNTER — Telehealth: Payer: Self-pay | Admitting: *Deleted

## 2014-02-24 ENCOUNTER — Ambulatory Visit (INDEPENDENT_AMBULATORY_CARE_PROVIDER_SITE_OTHER): Payer: Medicaid Other | Admitting: Advanced Practice Midwife

## 2014-02-24 VITALS — BP 113/71 | HR 83 | Temp 98.5°F | Wt 190.1 lb

## 2014-02-24 DIAGNOSIS — A749 Chlamydial infection, unspecified: Secondary | ICD-10-CM

## 2014-02-24 DIAGNOSIS — O0933 Supervision of pregnancy with insufficient antenatal care, third trimester: Secondary | ICD-10-CM

## 2014-02-24 DIAGNOSIS — Z3490 Encounter for supervision of normal pregnancy, unspecified, unspecified trimester: Secondary | ICD-10-CM

## 2014-02-24 DIAGNOSIS — Z23 Encounter for immunization: Secondary | ICD-10-CM

## 2014-02-24 DIAGNOSIS — O0930 Supervision of pregnancy with insufficient antenatal care, unspecified trimester: Secondary | ICD-10-CM

## 2014-02-24 LAB — POCT URINALYSIS DIP (DEVICE)
Bilirubin Urine: NEGATIVE
Glucose, UA: NEGATIVE mg/dL
Hgb urine dipstick: NEGATIVE
Ketones, ur: NEGATIVE mg/dL
Nitrite: NEGATIVE
PH: 7 (ref 5.0–8.0)
PROTEIN: NEGATIVE mg/dL
Specific Gravity, Urine: 1.02 (ref 1.005–1.030)
UROBILINOGEN UA: 0.2 mg/dL (ref 0.0–1.0)

## 2014-02-24 MED ORDER — TETANUS-DIPHTH-ACELL PERTUSSIS 5-2.5-18.5 LF-MCG/0.5 IM SUSP
0.5000 mL | Freq: Once | INTRAMUSCULAR | Status: AC
  Administered 2014-02-24: 0.5 mL via INTRAMUSCULAR

## 2014-02-24 NOTE — Progress Notes (Signed)
Patient reports pelvic pain/pressure  

## 2014-02-24 NOTE — Progress Notes (Signed)
Cannot stay for glucola.  Will do it Monday. States missed last visit because her boss would not let her leave. Is stopping work soon. Has more pressure than with first baby.

## 2014-02-24 NOTE — Telephone Encounter (Signed)
Pt left message stating that she has a medication question and question about diagnosis from visit today. Please call back.

## 2014-02-24 NOTE — Patient Instructions (Signed)
Third Trimester of Pregnancy The third trimester is from week 29 through week 42, months 7 through 9. The third trimester is a time when the fetus is growing rapidly. At the end of the ninth month, the fetus is about 20 inches in length and weighs 6-10 pounds.  BODY CHANGES Your body goes through many changes during pregnancy. The changes vary from woman to woman.   Your weight will continue to increase. You can expect to gain 25-35 pounds (11-16 kg) by the end of the pregnancy.  You may begin to get stretch marks on your hips, abdomen, and breasts.  You may urinate more often because the fetus is moving lower into your pelvis and pressing on your bladder.  You may develop or continue to have heartburn as a result of your pregnancy.  You may develop constipation because certain hormones are causing the muscles that push waste through your intestines to slow down.  You may develop hemorrhoids or swollen, bulging veins (varicose veins).  You may have pelvic pain because of the weight gain and pregnancy hormones relaxing your joints between the bones in your pelvis. Backaches may result from overexertion of the muscles supporting your posture.  You may have changes in your hair. These can include thickening of your hair, rapid growth, and changes in texture. Some women also have hair loss during or after pregnancy, or hair that feels dry or thin. Your hair will most likely return to normal after your baby is born.  Your breasts will continue to grow and be tender. A yellow discharge may leak from your breasts called colostrum.  Your belly button may stick out.  You may feel short of breath because of your expanding uterus.  You may notice the fetus "dropping," or moving lower in your abdomen.  You may have a bloody mucus discharge. This usually occurs a few days to a week before labor begins.  Your cervix becomes thin and soft (effaced) near your due date. WHAT TO EXPECT AT YOUR PRENATAL  EXAMS  You will have prenatal exams every 2 weeks until week 36. Then, you will have weekly prenatal exams. During a routine prenatal visit:  You will be weighed to make sure you and the fetus are growing normally.  Your blood pressure is taken.  Your abdomen will be measured to track your baby's growth.  The fetal heartbeat will be listened to.  Any test results from the previous visit will be discussed.  You may have a cervical check near your due date to see if you have effaced. At around 36 weeks, your caregiver will check your cervix. At the same time, your caregiver will also perform a test on the secretions of the vaginal tissue. This test is to determine if a type of bacteria, Group B streptococcus, is present. Your caregiver will explain this further. Your caregiver may ask you:  What your birth plan is.  How you are feeling.  If you are feeling the baby move.  If you have had any abnormal symptoms, such as leaking fluid, bleeding, severe headaches, or abdominal cramping.  If you have any questions. Other tests or screenings that may be performed during your third trimester include:  Blood tests that check for low iron levels (anemia).  Fetal testing to check the health, activity level, and growth of the fetus. Testing is done if you have certain medical conditions or if there are problems during the pregnancy. FALSE LABOR You may feel small, irregular contractions that   eventually go away. These are called Braxton Hicks contractions, or false labor. Contractions may last for hours, days, or even weeks before true labor sets in. If contractions come at regular intervals, intensify, or become painful, it is best to be seen by your caregiver.  SIGNS OF LABOR   Menstrual-like cramps.  Contractions that are 5 minutes apart or less.  Contractions that start on the top of the uterus and spread down to the lower abdomen and back.  A sense of increased pelvic pressure or back  pain.  A watery or bloody mucus discharge that comes from the vagina. If you have any of these signs before the 37th week of pregnancy, call your caregiver right away. You need to go to the hospital to get checked immediately. HOME CARE INSTRUCTIONS   Avoid all smoking, herbs, alcohol, and unprescribed drugs. These chemicals affect the formation and growth of the baby.  Follow your caregiver's instructions regarding medicine use. There are medicines that are either safe or unsafe to take during pregnancy.  Exercise only as directed by your caregiver. Experiencing uterine cramps is a good sign to stop exercising.  Continue to eat regular, healthy meals.  Wear a good support bra for breast tenderness.  Do not use hot tubs, steam rooms, or saunas.  Wear your seat belt at all times when driving.  Avoid raw meat, uncooked cheese, cat litter boxes, and soil used by cats. These carry germs that can cause birth defects in the baby.  Take your prenatal vitamins.  Try taking a stool softener (if your caregiver approves) if you develop constipation. Eat more high-fiber foods, such as fresh vegetables or fruit and whole grains. Drink plenty of fluids to keep your urine clear or pale yellow.  Take warm sitz baths to soothe any pain or discomfort caused by hemorrhoids. Use hemorrhoid cream if your caregiver approves.  If you develop varicose veins, wear support hose. Elevate your feet for 15 minutes, 3-4 times a day. Limit salt in your diet.  Avoid heavy lifting, wear low heal shoes, and practice good posture.  Rest a lot with your legs elevated if you have leg cramps or low back pain.  Visit your dentist if you have not gone during your pregnancy. Use a soft toothbrush to brush your teeth and be gentle when you floss.  A sexual relationship may be continued unless your caregiver directs you otherwise.  Do not travel far distances unless it is absolutely necessary and only with the approval  of your caregiver.  Take prenatal classes to understand, practice, and ask questions about the labor and delivery.  Make a trial run to the hospital.  Pack your hospital bag.  Prepare the baby's nursery.  Continue to go to all your prenatal visits as directed by your caregiver. SEEK MEDICAL CARE IF:  You are unsure if you are in labor or if your water has broken.  You have dizziness.  You have mild pelvic cramps, pelvic pressure, or nagging pain in your abdominal area.  You have persistent nausea, vomiting, or diarrhea.  You have a bad smelling vaginal discharge.  You have pain with urination. SEEK IMMEDIATE MEDICAL CARE IF:   You have a fever.  You are leaking fluid from your vagina.  You have spotting or bleeding from your vagina.  You have severe abdominal cramping or pain.  You have rapid weight loss or gain.  You have shortness of breath with chest pain.  You notice sudden or extreme swelling   of your face, hands, ankles, feet, or legs.  You have not felt your baby move in over an hour.  You have severe headaches that do not go away with medicine.  You have vision changes. Document Released: 05/22/2001 Document Revised: 06/02/2013 Document Reviewed: 07/29/2012 [employer] Patient Information 2015 [employer]. This information is not intended to replace advice given to you by your health care provider. Make sure you discuss any questions you have with your health care provider.  

## 2014-02-25 ENCOUNTER — Other Ambulatory Visit: Payer: Self-pay | Admitting: Obstetrics & Gynecology

## 2014-02-25 DIAGNOSIS — Z3490 Encounter for supervision of normal pregnancy, unspecified, unspecified trimester: Secondary | ICD-10-CM

## 2014-02-25 MED ORDER — PRENATAL PLUS 27-1 MG PO TABS: 1.0000 | ORAL_TABLET | Freq: Every day | ORAL | Status: DC

## 2014-02-25 NOTE — Telephone Encounter (Signed)
Called patient stating I am returning your phone call from yesterday. Patient states she is calling because she is still waiting on PNV to be sent to her [facility] pharmacy. Told patient I would get a months supply sent to her pharmacy and at her next visit she can get additional refills. Patient verbalized understanding and states she saw on her problem list from yesterday that she has a chlamydia infection but no one told her that and she wasn't able to sleep last night because she kept thinking about this. Apologized to patient and informed her that, that was there since she had the infection earlier this year but her most recent test was negative. Patient verbalized understanding and expressed gratitude. Patient had no other questions

## 2014-03-01 ENCOUNTER — Telehealth: Payer: Self-pay | Admitting: Obstetrics & Gynecology

## 2014-03-01 ENCOUNTER — Other Ambulatory Visit: Payer: Medicaid Other

## 2014-03-01 NOTE — Telephone Encounter (Signed)
Recording said number unavailable at this time, while attempting to reschedule missed lab appointment.

## 2014-03-10 ENCOUNTER — Ambulatory Visit (INDEPENDENT_AMBULATORY_CARE_PROVIDER_SITE_OTHER): Payer: Medicaid Other | Admitting: Family Medicine

## 2014-03-10 VITALS — BP 124/78 | HR 91 | Wt 192.3 lb

## 2014-03-10 DIAGNOSIS — Z23 Encounter for immunization: Secondary | ICD-10-CM

## 2014-03-10 DIAGNOSIS — O0930 Supervision of pregnancy with insufficient antenatal care, unspecified trimester: Secondary | ICD-10-CM

## 2014-03-10 DIAGNOSIS — O0933 Supervision of pregnancy with insufficient antenatal care, third trimester: Secondary | ICD-10-CM

## 2014-03-10 LAB — POCT URINALYSIS DIP (DEVICE)
Bilirubin Urine: NEGATIVE
GLUCOSE, UA: NEGATIVE mg/dL
Hgb urine dipstick: NEGATIVE
Ketones, ur: NEGATIVE mg/dL
Leukocytes, UA: NEGATIVE
Nitrite: NEGATIVE
PROTEIN: NEGATIVE mg/dL
Specific Gravity, Urine: 1.02 (ref 1.005–1.030)
UROBILINOGEN UA: 0.2 mg/dL (ref 0.0–1.0)
pH: 7.5 (ref 5.0–8.0)

## 2014-03-10 NOTE — Progress Notes (Signed)
Patient without complaints.  Denies vaginal bleeding, abnormal vaginal discharge, contractions, loss of fluid.  Denies abdominal pain, headache, scotoma.  Reports good fetal activity.  1hr GTT, RPR, CBC, HIV today.  Labor precautions reviewed.  Follow up in 2 weeks.

## 2014-03-10 NOTE — Patient Instructions (Signed)
Third Trimester of Pregnancy The third trimester is from week 29 through week 42, months 7 through 9. The third trimester is a time when the fetus is growing rapidly. At the end of the ninth month, the fetus is about 20 inches in length and weighs 6-10 pounds.  BODY CHANGES Your body goes through many changes during pregnancy. The changes vary from woman to woman.   Your weight will continue to increase. You can expect to gain 25-35 pounds (11-16 kg) by the end of the pregnancy.  You may begin to get stretch marks on your hips, abdomen, and breasts.  You may urinate more often because the fetus is moving lower into your pelvis and pressing on your bladder.  You may develop or continue to have heartburn as a result of your pregnancy.  You may develop constipation because certain hormones are causing the muscles that push waste through your intestines to slow down.  You may develop hemorrhoids or swollen, bulging veins (varicose veins).  You may have pelvic pain because of the weight gain and pregnancy hormones relaxing your joints between the bones in your pelvis. Backaches may result from overexertion of the muscles supporting your posture.  You may have changes in your hair. These can include thickening of your hair, rapid growth, and changes in texture. Some women also have hair loss during or after pregnancy, or hair that feels dry or thin. Your hair will most likely return to normal after your baby is born.  Your breasts will continue to grow and be tender. A yellow discharge may leak from your breasts called colostrum.  Your belly button may stick out.  You may feel short of breath because of your expanding uterus.  You may notice the fetus "dropping," or moving lower in your abdomen.  You may have a bloody mucus discharge. This usually occurs a few days to a week before labor begins.  Your cervix becomes thin and soft (effaced) near your due date. WHAT TO EXPECT AT YOUR PRENATAL  EXAMS  You will have prenatal exams every 2 weeks until week 36. Then, you will have weekly prenatal exams. During a routine prenatal visit:  You will be weighed to make sure you and the fetus are growing normally.  Your blood pressure is taken.  Your abdomen will be measured to track your baby's growth.  The fetal heartbeat will be listened to.  Any test results from the previous visit will be discussed.  You may have a cervical check near your due date to see if you have effaced. At around 36 weeks, your caregiver will check your cervix. At the same time, your caregiver will also perform a test on the secretions of the vaginal tissue. This test is to determine if a type of bacteria, Group B streptococcus, is present. Your caregiver will explain this further. Your caregiver may ask you:  What your birth plan is.  How you are feeling.  If you are feeling the baby move.  If you have had any abnormal symptoms, such as leaking fluid, bleeding, severe headaches, or abdominal cramping.  If you have any questions. Other tests or screenings that may be performed during your third trimester include:  Blood tests that check for low iron levels (anemia).  Fetal testing to check the health, activity level, and growth of the fetus. Testing is done if you have certain medical conditions or if there are problems during the pregnancy. FALSE LABOR You may feel small, irregular contractions that   eventually go away. These are called Braxton Hicks contractions, or false labor. Contractions may last for hours, days, or even weeks before true labor sets in. If contractions come at regular intervals, intensify, or become painful, it is best to be seen by your caregiver.  SIGNS OF LABOR   Menstrual-like cramps.  Contractions that are 5 minutes apart or less.  Contractions that start on the top of the uterus and spread down to the lower abdomen and back.  A sense of increased pelvic pressure or back  pain.  A watery or bloody mucus discharge that comes from the vagina. If you have any of these signs before the 37th week of pregnancy, call your caregiver right away. You need to go to the hospital to get checked immediately. HOME CARE INSTRUCTIONS   Avoid all smoking, herbs, alcohol, and unprescribed drugs. These chemicals affect the formation and growth of the baby.  Follow your caregiver's instructions regarding medicine use. There are medicines that are either safe or unsafe to take during pregnancy.  Exercise only as directed by your caregiver. Experiencing uterine cramps is a good sign to stop exercising.  Continue to eat regular, healthy meals.  Wear a good support bra for breast tenderness.  Do not use hot tubs, steam rooms, or saunas.  Wear your seat belt at all times when driving.  Avoid raw meat, uncooked cheese, cat litter boxes, and soil used by cats. These carry germs that can cause birth defects in the baby.  Take your prenatal vitamins.  Try taking a stool softener (if your caregiver approves) if you develop constipation. Eat more high-fiber foods, such as fresh vegetables or fruit and whole grains. Drink plenty of fluids to keep your urine clear or pale yellow.  Take warm sitz baths to soothe any pain or discomfort caused by hemorrhoids. Use hemorrhoid cream if your caregiver approves.  If you develop varicose veins, wear support hose. Elevate your feet for 15 minutes, 3-4 times a day. Limit salt in your diet.  Avoid heavy lifting, wear low heal shoes, and practice good posture.  Rest a lot with your legs elevated if you have leg cramps or low back pain.  Visit your dentist if you have not gone during your pregnancy. Use a soft toothbrush to brush your teeth and be gentle when you floss.  A sexual relationship may be continued unless your caregiver directs you otherwise.  Do not travel far distances unless it is absolutely necessary and only with the approval  of your caregiver.  Take prenatal classes to understand, practice, and ask questions about the labor and delivery.  Make a trial run to the hospital.  Pack your hospital bag.  Prepare the baby's nursery.  Continue to go to all your prenatal visits as directed by your caregiver. SEEK MEDICAL CARE IF:  You are unsure if you are in labor or if your water has broken.  You have dizziness.  You have mild pelvic cramps, pelvic pressure, or nagging pain in your abdominal area.  You have persistent nausea, vomiting, or diarrhea.  You have a bad smelling vaginal discharge.  You have pain with urination. SEEK IMMEDIATE MEDICAL CARE IF:   You have a fever.  You are leaking fluid from your vagina.  You have spotting or bleeding from your vagina.  You have severe abdominal cramping or pain.  You have rapid weight loss or gain.  You have shortness of breath with chest pain.  You notice sudden or extreme swelling   of your face, hands, ankles, feet, or legs.  You have not felt your baby move in over an hour.  You have severe headaches that do not go away with medicine.  You have vision changes. Document Released: 05/22/2001 Document Revised: 06/02/2013 Document Reviewed: 07/29/2012 [employer] Patient Information 2015 [employer]. This information is not intended to replace advice given to you by your health care provider. Make sure you discuss any questions you have with your health care provider.  

## 2014-03-10 NOTE — Progress Notes (Signed)
1 hr GTT today

## 2014-03-11 LAB — RPR

## 2014-03-11 LAB — CBC
HCT: 36.3 % (ref 36.0–46.0)
Hemoglobin: 12 g/dL (ref 12.0–15.0)
MCH: 25.2 pg — AB (ref 26.0–34.0)
MCHC: 33.1 g/dL (ref 30.0–36.0)
MCV: 76.3 fL — ABNORMAL LOW (ref 78.0–100.0)
PLATELETS: 163 10*3/uL (ref 150–400)
RBC: 4.76 MIL/uL (ref 3.87–5.11)
RDW: 14.7 % (ref 11.5–15.5)
WBC: 9.2 10*3/uL (ref 4.0–10.5)

## 2014-03-11 LAB — HIV ANTIBODY (ROUTINE TESTING W REFLEX): HIV 1&2 Ab, 4th Generation: NONREACTIVE

## 2014-03-11 LAB — GLUCOSE TOLERANCE, 1 HOUR (50G) W/O FASTING: GLUCOSE 1 HOUR GTT: 65 mg/dL — AB (ref 70–140)

## 2014-03-23 ENCOUNTER — Ambulatory Visit (INDEPENDENT_AMBULATORY_CARE_PROVIDER_SITE_OTHER): Payer: Medicaid Other | Admitting: Family Medicine

## 2014-03-23 VITALS — BP 107/58 | HR 97 | Temp 99.0°F | Wt 191.9 lb

## 2014-03-23 DIAGNOSIS — A749 Chlamydial infection, unspecified: Secondary | ICD-10-CM

## 2014-03-23 LAB — POCT URINALYSIS DIP (DEVICE)
BILIRUBIN URINE: NEGATIVE
GLUCOSE, UA: NEGATIVE mg/dL
Hgb urine dipstick: NEGATIVE
NITRITE: NEGATIVE
PH: 6.5 (ref 5.0–8.0)
Protein, ur: NEGATIVE mg/dL
Specific Gravity, Urine: 1.015 (ref 1.005–1.030)
UROBILINOGEN UA: 0.2 mg/dL (ref 0.0–1.0)

## 2014-03-23 NOTE — Progress Notes (Signed)
Patient without complaints.  Denies vaginal bleeding, abnormal vaginal discharge, contractions, loss of fluid.  Denies abdominal pain, headache, scotoma.  Reports good fetal activity.  Labor precautions reviewed.  Follow up in 2 weeks.  

## 2014-03-23 NOTE — Patient Instructions (Signed)
Third Trimester of Pregnancy The third trimester is from week 29 through week 42, months 7 through 9. The third trimester is a time when the fetus is growing rapidly. At the end of the ninth month, the fetus is about 20 inches in length and weighs 6-10 pounds.  BODY CHANGES Your body goes through many changes during pregnancy. The changes vary from woman to woman.   Your weight will continue to increase. You can expect to gain 25-35 pounds (11-16 kg) by the end of the pregnancy.  You may begin to get stretch marks on your hips, abdomen, and breasts.  You may urinate more often because the fetus is moving lower into your pelvis and pressing on your bladder.  You may develop or continue to have heartburn as a result of your pregnancy.  You may develop constipation because certain hormones are causing the muscles that push waste through your intestines to slow down.  You may develop hemorrhoids or swollen, bulging veins (varicose veins).  You may have pelvic pain because of the weight gain and pregnancy hormones relaxing your joints between the bones in your pelvis. Backaches may result from overexertion of the muscles supporting your posture.  You may have changes in your hair. These can include thickening of your hair, rapid growth, and changes in texture. Some women also have hair loss during or after pregnancy, or hair that feels dry or thin. Your hair will most likely return to normal after your baby is born.  Your breasts will continue to grow and be tender. A yellow discharge may leak from your breasts called colostrum.  Your belly button may stick out.  You may feel short of breath because of your expanding uterus.  You may notice the fetus "dropping," or moving lower in your abdomen.  You may have a bloody mucus discharge. This usually occurs a few days to a week before labor begins.  Your cervix becomes thin and soft (effaced) near your due date. WHAT TO EXPECT AT YOUR PRENATAL  EXAMS  You will have prenatal exams every 2 weeks until week 36. Then, you will have weekly prenatal exams. During a routine prenatal visit:  You will be weighed to make sure you and the fetus are growing normally.  Your blood pressure is taken.  Your abdomen will be measured to track your baby's growth.  The fetal heartbeat will be listened to.  Any test results from the previous visit will be discussed.  You may have a cervical check near your due date to see if you have effaced. At around 36 weeks, your caregiver will check your cervix. At the same time, your caregiver will also perform a test on the secretions of the vaginal tissue. This test is to determine if a type of bacteria, Group B streptococcus, is present. Your caregiver will explain this further. Your caregiver may ask you:  What your birth plan is.  How you are feeling.  If you are feeling the baby move.  If you have had any abnormal symptoms, such as leaking fluid, bleeding, severe headaches, or abdominal cramping.  If you have any questions. Other tests or screenings that may be performed during your third trimester include:  Blood tests that check for low iron levels (anemia).  Fetal testing to check the health, activity level, and growth of the fetus. Testing is done if you have certain medical conditions or if there are problems during the pregnancy. FALSE LABOR You may feel small, irregular contractions that   eventually go away. These are called Braxton Hicks contractions, or false labor. Contractions may last for hours, days, or even weeks before true labor sets in. If contractions come at regular intervals, intensify, or become painful, it is best to be seen by your caregiver.  SIGNS OF LABOR   Menstrual-like cramps.  Contractions that are 5 minutes apart or less.  Contractions that start on the top of the uterus and spread down to the lower abdomen and back.  A sense of increased pelvic pressure or back  pain.  A watery or bloody mucus discharge that comes from the vagina. If you have any of these signs before the 37th week of pregnancy, call your caregiver right away. You need to go to the hospital to get checked immediately. HOME CARE INSTRUCTIONS   Avoid all smoking, herbs, alcohol, and unprescribed drugs. These chemicals affect the formation and growth of the baby.  Follow your caregiver's instructions regarding medicine use. There are medicines that are either safe or unsafe to take during pregnancy.  Exercise only as directed by your caregiver. Experiencing uterine cramps is a good sign to stop exercising.  Continue to eat regular, healthy meals.  Wear a good support bra for breast tenderness.  Do not use hot tubs, steam rooms, or saunas.  Wear your seat belt at all times when driving.  Avoid raw meat, uncooked cheese, cat litter boxes, and soil used by cats. These carry germs that can cause birth defects in the baby.  Take your prenatal vitamins.  Try taking a stool softener (if your caregiver approves) if you develop constipation. Eat more high-fiber foods, such as fresh vegetables or fruit and whole grains. Drink plenty of fluids to keep your urine clear or pale yellow.  Take warm sitz baths to soothe any pain or discomfort caused by hemorrhoids. Use hemorrhoid cream if your caregiver approves.  If you develop varicose veins, wear support hose. Elevate your feet for 15 minutes, 3-4 times a day. Limit salt in your diet.  Avoid heavy lifting, wear low heal shoes, and practice good posture.  Rest a lot with your legs elevated if you have leg cramps or low back pain.  Visit your dentist if you have not gone during your pregnancy. Use a soft toothbrush to brush your teeth and be gentle when you floss.  A sexual relationship may be continued unless your caregiver directs you otherwise.  Do not travel far distances unless it is absolutely necessary and only with the approval  of your caregiver.  Take prenatal classes to understand, practice, and ask questions about the labor and delivery.  Make a trial run to the hospital.  Pack your hospital bag.  Prepare the baby's nursery.  Continue to go to all your prenatal visits as directed by your caregiver. SEEK MEDICAL CARE IF:  You are unsure if you are in labor or if your water has broken.  You have dizziness.  You have mild pelvic cramps, pelvic pressure, or nagging pain in your abdominal area.  You have persistent nausea, vomiting, or diarrhea.  You have a bad smelling vaginal discharge.  You have pain with urination. SEEK IMMEDIATE MEDICAL CARE IF:   You have a fever.  You are leaking fluid from your vagina.  You have spotting or bleeding from your vagina.  You have severe abdominal cramping or pain.  You have rapid weight loss or gain.  You have shortness of breath with chest pain.  You notice sudden or extreme swelling   of your face, hands, ankles, feet, or legs.  You have not felt your baby move in over an hour.  You have severe headaches that do not go away with medicine.  You have vision changes. Document Released: 05/22/2001 Document Revised: 06/02/2013 Document Reviewed: 07/29/2012 [employer] Patient Information 2015 [employer]. This information is not intended to replace advice given to you by your health care provider. Make sure you discuss any questions you have with your health care provider.  

## 2014-04-07 ENCOUNTER — Ambulatory Visit (INDEPENDENT_AMBULATORY_CARE_PROVIDER_SITE_OTHER): Payer: Medicaid Other | Admitting: Physician Assistant

## 2014-04-07 ENCOUNTER — Other Ambulatory Visit: Payer: Self-pay | Admitting: Physician Assistant

## 2014-04-07 VITALS — BP 112/79 | HR 113 | Temp 99.1°F | Wt 199.1 lb

## 2014-04-07 DIAGNOSIS — Z3490 Encounter for supervision of normal pregnancy, unspecified, unspecified trimester: Secondary | ICD-10-CM

## 2014-04-07 DIAGNOSIS — O0933 Supervision of pregnancy with insufficient antenatal care, third trimester: Secondary | ICD-10-CM

## 2014-04-07 LAB — OB RESULTS CONSOLE GC/CHLAMYDIA
Chlamydia: NEGATIVE
Gonorrhea: NEGATIVE

## 2014-04-07 LAB — POCT URINALYSIS DIP (DEVICE)
Bilirubin Urine: NEGATIVE
GLUCOSE, UA: NEGATIVE mg/dL
HGB URINE DIPSTICK: NEGATIVE
Ketones, ur: NEGATIVE mg/dL
NITRITE: NEGATIVE
PH: 6 (ref 5.0–8.0)
Protein, ur: NEGATIVE mg/dL
Specific Gravity, Urine: 1.025 (ref 1.005–1.030)
UROBILINOGEN UA: 0.2 mg/dL (ref 0.0–1.0)

## 2014-04-07 LAB — OB RESULTS CONSOLE GBS: GBS: NEGATIVE

## 2014-04-07 MED ORDER — PRENATAL PLUS 27-1 MG PO TABS: 1.0000 | ORAL_TABLET | Freq: Every day | ORAL | Status: DC

## 2014-04-07 NOTE — Patient Instructions (Signed)
Breastfeeding Deciding to breastfeed is one of the best choices you can make for you and your baby. A change in hormones during pregnancy causes your breast tissue to grow and increases the number and size of your milk ducts. These hormones also allow proteins, sugars, and fats from your blood supply to make breast milk in your milk-producing glands. Hormones prevent breast milk from being released before your baby is born as well as prompt milk flow after birth. Once breastfeeding has begun, thoughts of your baby, as well as his or her sucking or crying, can stimulate the release of milk from your milk-producing glands.  BENEFITS OF BREASTFEEDING For Your Baby  Your first milk (colostrum) helps your baby's digestive system function better.   There are antibodies in your milk that help your baby fight off infections.   Your baby has a lower incidence of asthma, allergies, and sudden infant death syndrome.   The nutrients in breast milk are better for your baby than infant formulas and are designed uniquely for your baby's needs.   Breast milk improves your baby's brain development.   Your baby is less likely to develop other conditions, such as childhood obesity, asthma, or type 2 diabetes mellitus.  For You   Breastfeeding helps to create a very special bond between you and your baby.   Breastfeeding is convenient. Breast milk is always available at the correct temperature and costs nothing.   Breastfeeding helps to burn calories and helps you lose the weight gained during pregnancy.   Breastfeeding makes your uterus contract to its prepregnancy size faster and slows bleeding (lochia) after you give birth.   Breastfeeding helps to lower your risk of developing type 2 diabetes mellitus, osteoporosis, and breast or ovarian cancer later in life. SIGNS THAT YOUR BABY IS HUNGRY Early Signs of Hunger  Increased alertness or activity.  Stretching.  Movement of the head from  side to side.  Movement of the head and opening of the mouth when the corner of the mouth or cheek is stroked (rooting).  Increased sucking sounds, smacking lips, cooing, sighing, or squeaking.  Hand-to-mouth movements.  Increased sucking of fingers or hands. Late Signs of Hunger  Fussing.  Intermittent crying. Extreme Signs of Hunger Signs of extreme hunger will require calming and consoling before your baby will be able to breastfeed successfully. Do not wait for the following signs of extreme hunger to occur before you initiate breastfeeding:   Restlessness.  A loud, strong cry.   Screaming. BREASTFEEDING BASICS Breastfeeding Initiation  Find a comfortable place to sit or lie down, with your neck and back well supported.  Place a pillow or rolled up blanket under your baby to bring him or her to the level of your breast (if you are seated). Nursing pillows are specially designed to help support your arms and your baby while you breastfeed.  Make sure that your baby's abdomen is facing your abdomen.   Gently massage your breast. With your fingertips, massage from your chest wall toward your nipple in a circular motion. This encourages milk flow. You may need to continue this action during the feeding if your milk flows slowly.  Support your breast with 4 fingers underneath and your thumb above your nipple. Make sure your fingers are well away from your nipple and your baby's mouth.   Stroke your baby's lips gently with your finger or nipple.   When your baby's mouth is open wide enough, quickly bring your baby to your   breast, placing your entire nipple and as much of the colored area around your nipple (areola) as possible into your baby's mouth.   More areola should be visible above your baby's upper lip than below the lower lip.   Your baby's tongue should be between his or her lower gum and your breast.   Ensure that your baby's mouth is correctly positioned  around your nipple (latched). Your baby's lips should create a seal on your breast and be turned out (everted).  It is common for your baby to suck about 2-3 minutes in order to start the flow of breast milk. Latching Teaching your baby how to latch on to your breast properly is very important. An improper latch can cause nipple pain and decreased milk supply for you and poor weight gain in your baby. Also, if your baby is not latched onto your nipple properly, he or she may swallow some air during feeding. This can make your baby fussy. Burping your baby when you switch breasts during the feeding can help to get rid of the air. However, teaching your baby to latch on properly is still the best way to prevent fussiness from swallowing air while breastfeeding. Signs that your baby has successfully latched on to your nipple:    Silent tugging or silent sucking, without causing you pain.   Swallowing heard between every 3-4 sucks.    Muscle movement above and in front of his or her ears while sucking.  Signs that your baby has not successfully latched on to nipple:   Sucking sounds or smacking sounds from your baby while breastfeeding.  Nipple pain. If you think your baby has not latched on correctly, slip your finger into the corner of your baby's mouth to break the suction and place it between your baby's gums. Attempt breastfeeding initiation again. Signs of Successful Breastfeeding Signs from your baby:   A gradual decrease in the number of sucks or complete cessation of sucking.   Falling asleep.   Relaxation of his or her body.   Retention of a small amount of milk in his or her mouth.   Letting go of your breast by himself or herself. Signs from you:  Breasts that have increased in firmness, weight, and size 1-3 hours after feeding.   Breasts that are softer immediately after breastfeeding.  Increased milk volume, as well as a change in milk consistency and color by  the fifth day of breastfeeding.   Nipples that are not sore, cracked, or bleeding. Signs That Your Baby is Getting Enough Milk  Wetting at least 3 diapers in a 24-hour period. The urine should be clear and pale yellow by age 5 days.  At least 3 stools in a 24-hour period by age 5 days. The stool should be soft and yellow.  At least 3 stools in a 24-hour period by age 7 days. The stool should be seedy and yellow.  No loss of weight greater than 10% of birth weight during the first 3 days of age.  Average weight gain of 4-7 ounces (113-198 g) per week after age 4 days.  Consistent daily weight gain by age 5 days, without weight loss after the age of 2 weeks. After a feeding, your baby may spit up a small amount. This is common. BREASTFEEDING FREQUENCY AND DURATION Frequent feeding will help you make more milk and can prevent sore nipples and breast engorgement. Breastfeed when you feel the need to reduce the fullness of your breasts   or when your baby shows signs of hunger. This is called "breastfeeding on demand." Avoid introducing a pacifier to your baby while you are working to establish breastfeeding (the first 4-6 weeks after your baby is born). After this time you may choose to use a pacifier. Research has shown that pacifier use during the first year of a baby's life decreases the risk of sudden infant death syndrome (SIDS). Allow your baby to feed on each breast as long as he or she wants. Breastfeed until your baby is finished feeding. When your baby unlatches or falls asleep while feeding from the first breast, offer the second breast. Because newborns are often sleepy in the first few weeks of life, you may need to awaken your baby to get him or her to feed. Breastfeeding times will vary from baby to baby. However, the following rules can serve as a guide to help you ensure that your baby is properly fed:  Newborns (babies 4 weeks of age or younger) may breastfeed every 1-3  hours.  Newborns should not go longer than 3 hours during the day or 5 hours during the night without breastfeeding.  You should breastfeed your baby a minimum of 8 times in a 24-hour period until you begin to introduce solid foods to your baby at around 6 months of age. BREAST MILK PUMPING Pumping and storing breast milk allows you to ensure that your baby is exclusively fed your breast milk, even at times when you are unable to breastfeed. This is especially important if you are going back to work while you are still breastfeeding or when you are not able to be present during feedings. Your lactation consultant can give you guidelines on how long it is safe to store breast milk.  A breast pump is a machine that allows you to pump milk from your breast into a sterile bottle. The pumped breast milk can then be stored in a refrigerator or freezer. Some breast pumps are operated by hand, while others use electricity. Ask your lactation consultant which type will work best for you. Breast pumps can be purchased, but some hospitals and breastfeeding support groups lease breast pumps on a monthly basis. A lactation consultant can teach you how to hand express breast milk, if you prefer not to use a pump.  CARING FOR YOUR BREASTS WHILE YOU BREASTFEED Nipples can become dry, cracked, and sore while breastfeeding. The following recommendations can help keep your breasts moisturized and healthy:  Avoid using soap on your nipples.   Wear a supportive bra. Although not required, special nursing bras and tank tops are designed to allow access to your breasts for breastfeeding without taking off your entire bra or top. Avoid wearing underwire-style bras or extremely tight bras.  Air dry your nipples for 3-4minutes after each feeding.   Use only cotton bra pads to absorb leaked breast milk. Leaking of breast milk between feedings is normal.   Use lanolin on your nipples after breastfeeding. Lanolin helps to  maintain your skin's normal moisture barrier. If you use pure lanolin, you do not need to wash it off before feeding your baby again. Pure lanolin is not toxic to your baby. You may also hand express a few drops of breast milk and gently massage that milk into your nipples and allow the milk to air dry. In the first few weeks after giving birth, some women experience extremely full breasts (engorgement). Engorgement can make your breasts feel heavy, warm, and tender to the   touch. Engorgement peaks within 3-5 days after you give birth. The following recommendations can help ease engorgement:  Completely empty your breasts while breastfeeding or pumping. You may want to start by applying warm, moist heat (in the shower or with warm water-soaked hand towels) just before feeding or pumping. This increases circulation and helps the milk flow. If your baby does not completely empty your breasts while breastfeeding, pump any extra milk after he or she is finished.  Wear a snug bra (nursing or regular) or tank top for 1-2 days to signal your body to slightly decrease milk production.  Apply ice packs to your breasts, unless this is too uncomfortable for you.  Make sure that your baby is latched on and positioned properly while breastfeeding. If engorgement persists after 48 hours of following these recommendations, contact your health care provider or a lactation consultant. OVERALL HEALTH CARE RECOMMENDATIONS WHILE BREASTFEEDING  Eat healthy foods. Alternate between meals and snacks, eating 3 of each per day. Because what you eat affects your breast milk, some of the foods may make your baby more irritable than usual. Avoid eating these foods if you are sure that they are negatively affecting your baby.  Drink milk, fruit juice, and water to satisfy your thirst (about 10 glasses a day).   Rest often, relax, and continue to take your prenatal vitamins to prevent fatigue, stress, and anemia.  Continue  breast self-awareness checks.  Avoid chewing and smoking tobacco.  Avoid alcohol and drug use. Some medicines that may be harmful to your baby can pass through breast milk. It is important to ask your health care provider before taking any medicine, including all over-the-counter and prescription medicine as well as vitamin and herbal supplements. It is possible to become pregnant while breastfeeding. If birth control is desired, ask your health care provider about options that will be safe for your baby. SEEK MEDICAL CARE IF:   You feel like you want to stop breastfeeding or have become frustrated with breastfeeding.  You have painful breasts or nipples.  Your nipples are cracked or bleeding.  Your breasts are red, tender, or warm.  You have a swollen area on either breast.  You have a fever or chills.  You have nausea or vomiting.  You have drainage other than breast milk from your nipples.  Your breasts do not become full before feedings by the fifth day after you give birth.  You feel sad and depressed.  Your baby is too sleepy to eat well.  Your baby is having trouble sleeping.   Your baby is wetting less than 3 diapers in a 24-hour period.  Your baby has less than 3 stools in a 24-hour period.  Your baby's skin or the white part of his or her eyes becomes yellow.   Your baby is not gaining weight by 5 days of age. SEEK IMMEDIATE MEDICAL CARE IF:   Your baby is overly tired (lethargic) and does not want to wake up and feed.  Your baby develops an unexplained fever. Document Released: 05/28/2005 Document Revised: 06/02/2013 Document Reviewed: 11/19/2012 [employer] Patient Information 2015 [employer]. This information is not intended to replace advice given to you by your health care provider. Make sure you discuss any questions you have with your health care provider.  

## 2014-04-07 NOTE — Progress Notes (Signed)
GBS and cultures today.  Requests RX refill for PNV.

## 2014-04-07 NOTE — Progress Notes (Signed)
36 weeks.  Good fetal movement.  NO complaints.  Denies LOF, vaginal bleeding, dysuria. Uses marijuana - last time 2 weeks ago. Cultures done today. RTC 1 week

## 2014-04-08 LAB — GC/CHLAMYDIA PROBE AMP
CT Probe RNA: NEGATIVE
GC PROBE AMP APTIMA: NEGATIVE

## 2014-04-09 LAB — CULTURE, BETA STREP (GROUP B ONLY)

## 2014-04-12 ENCOUNTER — Encounter: Payer: Self-pay | Admitting: Obstetrics & Gynecology

## 2014-04-14 ENCOUNTER — Encounter: Payer: Medicaid Other | Admitting: Advanced Practice Midwife

## 2014-04-14 ENCOUNTER — Encounter: Payer: Self-pay | Admitting: Advanced Practice Midwife

## 2014-04-26 ENCOUNTER — Encounter: Payer: Medicaid Other | Admitting: Advanced Practice Midwife

## 2014-04-26 ENCOUNTER — Ambulatory Visit (INDEPENDENT_AMBULATORY_CARE_PROVIDER_SITE_OTHER): Payer: Medicaid Other | Admitting: Family Medicine

## 2014-04-26 VITALS — BP 121/76 | HR 94 | Wt 204.3 lb

## 2014-04-26 DIAGNOSIS — O0933 Supervision of pregnancy with insufficient antenatal care, third trimester: Secondary | ICD-10-CM

## 2014-04-26 LAB — POCT URINALYSIS DIP (DEVICE)
Bilirubin Urine: NEGATIVE
GLUCOSE, UA: NEGATIVE mg/dL
Hgb urine dipstick: NEGATIVE
Ketones, ur: NEGATIVE mg/dL
NITRITE: NEGATIVE
Protein, ur: NEGATIVE mg/dL
Specific Gravity, Urine: 1.025 (ref 1.005–1.030)
UROBILINOGEN UA: 0.2 mg/dL (ref 0.0–1.0)
pH: 6.5 (ref 5.0–8.0)

## 2014-04-26 NOTE — Progress Notes (Signed)
6773w5d. Good fetal movement. Only complaint is increasing lower pelvic pressure. Some pink on toilet paper last night after pressure last night. Denies LOF, vaginal bleeding, dysuria.  SVE with change from 0/th/high --> 2-3/50%/-3. Uses marijuana - needs utox in MAU when admitted GBS neg.  RTC 1 week

## 2014-04-26 NOTE — Progress Notes (Signed)
Patient reports some spotting.

## 2014-04-28 ENCOUNTER — Encounter: Payer: Self-pay | Admitting: General Practice

## 2014-05-03 ENCOUNTER — Ambulatory Visit (INDEPENDENT_AMBULATORY_CARE_PROVIDER_SITE_OTHER): Payer: Medicaid Other | Admitting: Advanced Practice Midwife

## 2014-05-03 VITALS — BP 128/74 | HR 98 | Temp 98.1°F | Wt 205.9 lb

## 2014-05-03 DIAGNOSIS — O0933 Supervision of pregnancy with insufficient antenatal care, third trimester: Secondary | ICD-10-CM

## 2014-05-03 LAB — POCT URINALYSIS DIP (DEVICE)
Bilirubin Urine: NEGATIVE
GLUCOSE, UA: NEGATIVE mg/dL
Hgb urine dipstick: NEGATIVE
Ketones, ur: NEGATIVE mg/dL
NITRITE: NEGATIVE
Protein, ur: NEGATIVE mg/dL
Specific Gravity, Urine: 1.02 (ref 1.005–1.030)
UROBILINOGEN UA: 0.2 mg/dL (ref 0.0–1.0)
pH: 6.5 (ref 5.0–8.0)

## 2014-05-03 NOTE — Progress Notes (Signed)
Doing well.  Good fetal movement, denies vaginal bleeding, LOF, regular contractions.  Reports intermittent contractions sometimes painful for last week.  Desires cervical exam.  Discussed membrane sweeping/stripping with pt, risks/benefits. Membranes swept during exam per pt request.

## 2014-05-03 NOTE — Progress Notes (Signed)
Reports intermittent pelvic pressure and contractions.  

## 2014-05-05 ENCOUNTER — Inpatient Hospital Stay (HOSPITAL_COMMUNITY)
Admission: AD | Admit: 2014-05-05 | Discharge: 2014-05-05 | Disposition: A | Discharge status: Home / Self Care | Payer: Medicaid Other | Source: Ambulatory Visit | Attending: Family Medicine | Admitting: Family Medicine

## 2014-05-05 ENCOUNTER — Encounter (HOSPITAL_COMMUNITY): Payer: Self-pay | Admitting: *Deleted

## 2014-05-05 DIAGNOSIS — O26893 Other specified pregnancy related conditions, third trimester: Secondary | ICD-10-CM | POA: Diagnosis not present

## 2014-05-05 DIAGNOSIS — R21 Rash and other nonspecific skin eruption: Secondary | ICD-10-CM | POA: Insufficient documentation

## 2014-05-05 DIAGNOSIS — Z87891 Personal history of nicotine dependence: Secondary | ICD-10-CM | POA: Diagnosis not present

## 2014-05-05 DIAGNOSIS — Z3A40 40 weeks gestation of pregnancy: Secondary | ICD-10-CM | POA: Diagnosis not present

## 2014-05-05 LAB — WET PREP, GENITAL
TRICH WET PREP: NONE SEEN
Yeast Wet Prep HPF POC: NONE SEEN

## 2014-05-05 MED ORDER — DIPHENHYDRAMINE HCL 25 MG PO CAPS
50.0000 mg | ORAL_CAPSULE | Freq: Once | ORAL | Status: AC
  Administered 2014-05-05: 50 mg via ORAL
  Filled 2014-05-05 (×2): qty 2

## 2014-05-05 MED ORDER — FAMOTIDINE 20 MG PO TABS
20.0000 mg | ORAL_TABLET | Freq: Once | ORAL | Status: AC
  Administered 2014-05-05: 20 mg via ORAL
  Filled 2014-05-05: qty 1

## 2014-05-05 NOTE — MAU Note (Signed)
Pt states she has had the rash since 1530 today . Pt states she started out with a couple of bumps and feels it is spreading now. Pt left work to have rash evaluated.

## 2014-05-05 NOTE — MAU Provider Note (Signed)
History  Chief Complaint:  Rash and Back Pain  Gail Robinson is a 23 y.o. 343P1011 female at 4047w0d presenting with a itchy rash on the back of her neck and on right upper arm since around 1500 today.  Has never had anything like it before.  Denies eating anything new or different.  Denies any hair coloring, chemical exposure or new soaps/detergents.   Reports active fetal movement, contractions: irregular, vaginal bleeding: none, membranes: intact. Denies uti s/s.  Reports increased malodorous vag d/c for past 3 days.   [facility]  Next visit next week. Pregnancy complicated by late prenatal care.  Obstetrical History: OB History    Gravida Para Term Preterm AB TAB SAB Ectopic Multiple Living   3 1 1  1 1    1       Past Medical History: Past Medical History  Diagnosis Date  . Asthma     childhood    Past Surgical History: History reviewed. No pertinent past surgical history.  Social History: History   Social History  . Marital Status: Single    Spouse Name: N/A    Number of Children: N/A  . Years of Education: N/A   Social History Main Topics  . Smoking status: Former Smoker -- 0.30 packs/day    Types: Cigarettes  . Smokeless tobacco: Never Used  . Alcohol Use: No  . Drug Use: No  . Sexual Activity: Yes    Birth Control/ Protection: Pill   Other Topics Concern  . None   Social History Narrative    Allergies: No Known Allergies  Prescriptions prior to admission  Medication Sig Dispense Refill Last Dose  . beclomethasone (QVAR) 80 MCG/ACT inhaler Inhale 2 puffs into the lungs 2 (two) times daily. 1 Inhaler 12 05/04/2014 at Unknown time  . prenatal vitamin w/FE, FA (PRENATAL 1 + 1) 27-1 MG TABS tablet Take 1 tablet by mouth daily at 12 noon. 30 each 3 05/05/2014 at Unknown time  . albuterol (PROVENTIL HFA;VENTOLIN HFA) 108 (90 BASE) MCG/ACT inhaler Inhale 1-2 puffs into the lungs every 6 (six) hours as needed for wheezing or shortness of breath.  1 Inhaler 12 Rescue  . albuterol (PROVENTIL) (2.5 MG/3ML) 0.083% nebulizer solution Take 3 mLs (2.5 mg total) by nebulization every 6 (six) hours as needed for wheezing. 75 mL 12 Rescue    Review of Systems  Pertinent pos/neg as indicated in HPI  Physical Exam  Blood pressure 120/68, pulse 85, temperature 98.5 F (36.9 C), temperature source Oral, resp. rate 16, height 5\' 6"  (1.676 m), last menstrual period 08/04/2013, SpO2 99 %. General appearance: alert, cooperative, appears stated age and no distress  Skin: Cluster of pea-sized raised hive-like, skin colored bumps on center of posterior neck. Same appearance of  bumps along right side of neck and under right jaw line.  More of same hive-like bumps on upper right arm.   None visualized on any other part of body. All areas are closed, with no drainage or bleeding noted. Lungs: clear to auscultation bilaterally, normal effort Heart: regular rate and rhythm Abdomen: gravid, soft, non-tender Extremities: no edema Pelvic: Moderate amt of light yellow, thin, malodorous vaginal discharge   Spec exam: deferred Cultures/Specimens: wet prep    Presentation: cephalic Fetal monitoring: FHR: 130 bpm, variability: moderate,  Accelerations: Present,  decelerations:  Absent Uterine activity: irregular contractions  MAU Course  Skin assessment Cervical exam labwork  Labs:  No results found for this or any previous visit (from  the past 24 hour(s)).  Imaging:  N/A  Assessment and Plan  A:  4473w0d SIUP  G3P1011  Rash on neck/ upper right arm  Cat 1 FHR  Wet prep results pending P:  Benadryl 50mg  po x 1 dose    Pepcid 20mg  po x 1 dose  Reviewed labor precautions and fetal activity counts  [facility]   ADAMS,SHNIQUAL SHWON Student [place] 11/25/20159:07 PM  Seen also by me Agree with note   Medication List    ASK your doctor about these medications        albuterol 108 (90 BASE) MCG/ACT inhaler  Commonly  known as:  PROVENTIL HFA;VENTOLIN HFA  Inhale 1-2 puffs into the lungs every 6 (six) hours as needed for wheezing or shortness of breath.     albuterol (2.5 MG/3ML) 0.083% nebulizer solution  Commonly known as:  PROVENTIL  Take 3 mLs (2.5 mg total) by nebulization every 6 (six) hours as needed for wheezing.     beclomethasone 80 MCG/ACT inhaler  Commonly known as:  QVAR  Inhale 2 puffs into the lungs 2 (two) times daily.     prenatal vitamin w/FE, FA 27-1 MG Tabs tablet  Take 1 tablet by mouth daily at 12 noon.

## 2014-05-05 NOTE — MAU Note (Signed)
Patient states she has a rash on the back of her neck that is hot and itchy. Has had back pain for a while but is getting worse, irregular contractions. Denies bleeding or leaking, has a slight vaginal discharge. Reports good fetal movement.

## 2014-05-05 NOTE — Discharge Instructions (Signed)
Third Trimester of Pregnancy °The third trimester is from week 29 through week 42, months 7 through 9. The third trimester is a time when the fetus is growing rapidly. At the end of the ninth month, the fetus is about 20 inches in length and weighs 6-10 pounds.  °BODY CHANGES °Your body goes through many changes during pregnancy. The changes vary from woman to woman.  °· Your weight will continue to increase. You can expect to gain 25-35 pounds (11-16 kg) by the end of the pregnancy. °· You may begin to get stretch marks on your hips, abdomen, and breasts. °· You may urinate more often because the fetus is moving lower into your pelvis and pressing on your bladder. °· You may develop or continue to have heartburn as a result of your pregnancy. °· You may develop constipation because certain hormones are causing the muscles that push waste through your intestines to slow down. °· You may develop hemorrhoids or swollen, bulging veins (varicose veins). °· You may have pelvic pain because of the weight gain and pregnancy hormones relaxing your joints between the bones in your pelvis. Backaches may result from overexertion of the muscles supporting your posture. °· You may have changes in your hair. These can include thickening of your hair, rapid growth, and changes in texture. Some women also have hair loss during or after pregnancy, or hair that feels dry or thin. Your hair will most likely return to normal after your baby is born. °· Your breasts will continue to grow and be tender. A yellow discharge may leak from your breasts called colostrum. °· Your belly button may stick out. °· You may feel short of breath because of your expanding uterus. °· You may notice the fetus "dropping," or moving lower in your abdomen. °· You may have a bloody mucus discharge. This usually occurs a few days to a week before labor begins. °· Your cervix becomes thin and soft (effaced) near your due date. °WHAT TO EXPECT AT YOUR PRENATAL  EXAMS  °You will have prenatal exams every 2 weeks until week 36. Then, you will have weekly prenatal exams. During a routine prenatal visit: °· You will be weighed to make sure you and the fetus are growing normally. °· Your blood pressure is taken. °· Your abdomen will be measured to track your baby's growth. °· The fetal heartbeat will be listened to. °· Any test results from the previous visit will be discussed. °· You may have a cervical check near your due date to see if you have effaced. °At around 36 weeks, your caregiver will check your cervix. At the same time, your caregiver will also perform a test on the secretions of the vaginal tissue. This test is to determine if a type of bacteria, Group B streptococcus, is present. Your caregiver will explain this further. °Your caregiver may ask you: °· What your birth plan is. °· How you are feeling. °· If you are feeling the baby move. °· If you have had any abnormal symptoms, such as leaking fluid, bleeding, severe headaches, or abdominal cramping. °· If you have any questions. °Other tests or screenings that may be performed during your third trimester include: °· Blood tests that check for low iron levels (anemia). °· Fetal testing to check the health, activity level, and growth of the fetus. Testing is done if you have certain medical conditions or if there are problems during the pregnancy. °FALSE LABOR °You may feel small, irregular contractions that   eventually go away. These are called Braxton Hicks contractions, or false labor. Contractions may last for hours, days, or even weeks before true labor sets in. If contractions come at regular intervals, intensify, or become painful, it is best to be seen by your caregiver.  °SIGNS OF LABOR  °· Menstrual-like cramps. °· Contractions that are 5 minutes apart or less. °· Contractions that start on the top of the uterus and spread down to the lower abdomen and back. °· A sense of increased pelvic pressure or back  pain. °· A watery or bloody mucus discharge that comes from the vagina. °If you have any of these signs before the 37th week of pregnancy, call your caregiver right away. You need to go to the hospital to get checked immediately. °HOME CARE INSTRUCTIONS  °· Avoid all smoking, herbs, alcohol, and unprescribed drugs. These chemicals affect the formation and growth of the baby. °· Follow your caregiver's instructions regarding medicine use. There are medicines that are either safe or unsafe to take during pregnancy. °· Exercise only as directed by your caregiver. Experiencing uterine cramps is a good sign to stop exercising. °· Continue to eat regular, healthy meals. °· Wear a good support bra for breast tenderness. °· Do not use hot tubs, steam rooms, or saunas. °· Wear your seat belt at all times when driving. °· Avoid raw meat, uncooked cheese, cat litter boxes, and soil used by cats. These carry germs that can cause birth defects in the baby. °· Take your prenatal vitamins. °· Try taking a stool softener (if your caregiver approves) if you develop constipation. Eat more high-fiber foods, such as fresh vegetables or fruit and whole grains. Drink plenty of fluids to keep your urine clear or pale yellow. °· Take warm sitz baths to soothe any pain or discomfort caused by hemorrhoids. Use hemorrhoid cream if your caregiver approves. °· If you develop varicose veins, wear support hose. Elevate your feet for 15 minutes, 3-4 times a day. Limit salt in your diet. °· Avoid heavy lifting, wear low heal shoes, and practice good posture. °· Rest a lot with your legs elevated if you have leg cramps or low back pain. °· Visit your dentist if you have not gone during your pregnancy. Use a soft toothbrush to brush your teeth and be gentle when you floss. °· A sexual relationship may be continued unless your caregiver directs you otherwise. °· Do not travel far distances unless it is absolutely necessary and only with the approval  of your caregiver. °· Take prenatal classes to understand, practice, and ask questions about the labor and delivery. °· Make a trial run to the hospital. °· Pack your hospital bag. °· Prepare the baby's nursery. °· Continue to go to all your prenatal visits as directed by your caregiver. °SEEK MEDICAL CARE IF: °· You are unsure if you are in labor or if your water has broken. °· You have dizziness. °· You have mild pelvic cramps, pelvic pressure, or nagging pain in your abdominal area. °· You have persistent nausea, vomiting, or diarrhea. °· You have a bad smelling vaginal discharge. °· You have pain with urination. °SEEK IMMEDIATE MEDICAL CARE IF:  °· You have a fever. °· You are leaking fluid from your vagina. °· You have spotting or bleeding from your vagina. °· You have severe abdominal cramping or pain. °· You have rapid weight loss or gain. °· You have shortness of breath with chest pain. °· You notice sudden or extreme swelling   of your face, hands, ankles, feet, or legs.  You have not felt your baby move in over an hour.  You have severe headaches that do not go away with medicine.  You have vision changes. Document Released: 05/22/2001 Document Revised: 06/02/2013 Document Reviewed: 07/29/2012 [facility]ExitCare Patient Information 2015 [employer], [place]. This information is not intended to replace advice given to you by your health care provider. Make sure you discuss any questions you have with your health care provider. Rash A rash is a change in the color or texture of your skin. There are many different types of rashes. You may have other problems that accompany your rash. CAUSES   Infections.  Allergic reactions. This can include allergies to pets or foods.  Certain medicines.  Exposure to certain chemicals, soaps, or cosmetics.  Heat.  Exposure to poisonous plants.  Tumors, both cancerous and noncancerous. SYMPTOMS   Redness.  Scaly skin.  Itchy skin.  Dry or cracked  skin.  Bumps.  Blisters.  Pain. DIAGNOSIS  Your caregiver may do a physical exam to determine what type of rash you have. A skin sample (biopsy) may be taken and examined under a microscope. TREATMENT  Treatment depends on the type of rash you have. Your caregiver may prescribe certain medicines. For serious conditions, you may need to see a skin doctor (dermatologist). HOME CARE INSTRUCTIONS   Avoid the substance that caused your rash.  Do not scratch your rash. This can cause infection.  You may take cool baths to help stop itching.  Only take over-the-counter or prescription medicines as directed by your caregiver.  Keep all follow-up appointments as directed by your caregiver. SEEK IMMEDIATE MEDICAL CARE IF:  You have increasing pain, swelling, or redness.  You have a fever.  You have new or severe symptoms.  You have body aches, diarrhea, or vomiting.  Your rash is not better after 3 days. MAKE SURE YOU:  Understand these instructions.  Will watch your condition.  Will get help right away if you are not doing well or get worse. Document Released: 05/18/2002 Document Revised: 08/20/2011 Document Reviewed: 03/12/2011 [facility] Patient Information 2015 [employer], [place]. This information is not intended to replace advice given to you by your health care provider. Make sure you discuss any questions you have with your health care provider.  May take over the counter Benadryl as directed for rash.

## 2014-05-09 ENCOUNTER — Inpatient Hospital Stay (HOSPITAL_COMMUNITY)
Admission: AD | Admit: 2014-05-09 | Discharge: 2014-05-12 | DRG: 775 | Disposition: A | Discharge status: Home / Self Care | Payer: Medicaid Other | Source: Ambulatory Visit | Attending: Obstetrics & Gynecology | Admitting: Obstetrics & Gynecology

## 2014-05-09 ENCOUNTER — Inpatient Hospital Stay (HOSPITAL_COMMUNITY): Payer: Medicaid Other | Admitting: Anesthesiology

## 2014-05-09 ENCOUNTER — Encounter (HOSPITAL_COMMUNITY): Payer: Self-pay | Admitting: *Deleted

## 2014-05-09 DIAGNOSIS — F1210 Cannabis abuse, uncomplicated: Secondary | ICD-10-CM | POA: Diagnosis present

## 2014-05-09 DIAGNOSIS — Z3A40 40 weeks gestation of pregnancy: Secondary | ICD-10-CM | POA: Diagnosis present

## 2014-05-09 DIAGNOSIS — O99324 Drug use complicating childbirth: Secondary | ICD-10-CM | POA: Diagnosis present

## 2014-05-09 DIAGNOSIS — O0933 Supervision of pregnancy with insufficient antenatal care, third trimester: Secondary | ICD-10-CM

## 2014-05-09 DIAGNOSIS — O480 Post-term pregnancy: Secondary | ICD-10-CM | POA: Diagnosis present

## 2014-05-09 DIAGNOSIS — O09299 Supervision of pregnancy with other poor reproductive or obstetric history, unspecified trimester: Secondary | ICD-10-CM

## 2014-05-09 DIAGNOSIS — Z3483 Encounter for supervision of other normal pregnancy, third trimester: Secondary | ICD-10-CM | POA: Diagnosis present

## 2014-05-09 DIAGNOSIS — IMO0001 Reserved for inherently not codable concepts without codable children: Secondary | ICD-10-CM

## 2014-05-09 LAB — CBC
HEMATOCRIT: 41 % (ref 36.0–46.0)
Hemoglobin: 13.8 g/dL (ref 12.0–15.0)
MCH: 25.9 pg — ABNORMAL LOW (ref 26.0–34.0)
MCHC: 33.7 g/dL (ref 30.0–36.0)
MCV: 77.1 fL — ABNORMAL LOW (ref 78.0–100.0)
Platelets: 148 10*3/uL — ABNORMAL LOW (ref 150–400)
RBC: 5.32 MIL/uL — AB (ref 3.87–5.11)
RDW: 14.4 % (ref 11.5–15.5)
WBC: 17.1 10*3/uL — ABNORMAL HIGH (ref 4.0–10.5)

## 2014-05-09 MED ORDER — LACTATED RINGERS IV SOLN: 500.0000 mL | INTRAVENOUS | Status: DC | PRN

## 2014-05-09 MED ORDER — FENTANYL 2.5 MCG/ML BUPIVACAINE 1/10 % EPIDURAL INFUSION (WH - ANES)
14.0000 mL/h | INTRAMUSCULAR | Status: DC | PRN
  Administered 2014-05-09: 14 mL/h via EPIDURAL
  Filled 2014-05-09: qty 125

## 2014-05-09 MED ORDER — OXYCODONE-ACETAMINOPHEN 5-325 MG PO TABS
1.0000 | ORAL_TABLET | ORAL | Status: DC | PRN
Start: 2014-05-09 — End: 2014-05-10

## 2014-05-09 MED ORDER — ACETAMINOPHEN 325 MG PO TABS: 650.0000 mg | ORAL_TABLET | ORAL | Status: DC | PRN

## 2014-05-09 MED ORDER — FENTANYL 2.5 MCG/ML BUPIVACAINE 1/10 % EPIDURAL INFUSION (WH - ANES)
INTRAMUSCULAR | Status: DC | PRN
  Administered 2014-05-09: 14 mL/h via EPIDURAL

## 2014-05-09 MED ORDER — LACTATED RINGERS IV SOLN
INTRAVENOUS | Status: DC
  Administered 2014-05-09: 22:00:00 via INTRAVENOUS

## 2014-05-09 MED ORDER — LACTATED RINGERS IV SOLN
500.0000 mL | Freq: Once | INTRAVENOUS | Status: AC
  Administered 2014-05-09: 500 mL via INTRAVENOUS

## 2014-05-09 MED ORDER — LIDOCAINE HCL (PF) 1 % IJ SOLN
INTRAMUSCULAR | Status: DC | PRN
  Administered 2014-05-09 (×2): 8 mL

## 2014-05-09 MED ORDER — LIDOCAINE HCL (PF) 1 % IJ SOLN
30.0000 mL | INTRAMUSCULAR | Status: DC | PRN
  Filled 2014-05-09: qty 30

## 2014-05-09 MED ORDER — PHENYLEPHRINE 40 MCG/ML (10ML) SYRINGE FOR IV PUSH (FOR BLOOD PRESSURE SUPPORT)
80.0000 ug | PREFILLED_SYRINGE | INTRAVENOUS | Status: DC | PRN
  Administered 2014-05-09 (×2): 80 ug via INTRAVENOUS

## 2014-05-09 MED ORDER — EPHEDRINE 5 MG/ML INJ: 10.0000 mg | INTRAVENOUS | Status: DC | PRN

## 2014-05-09 MED ORDER — PHENYLEPHRINE 40 MCG/ML (10ML) SYRINGE FOR IV PUSH (FOR BLOOD PRESSURE SUPPORT)
80.0000 ug | PREFILLED_SYRINGE | INTRAVENOUS | Status: DC | PRN
  Filled 2014-05-09: qty 10

## 2014-05-09 MED ORDER — CITRIC ACID-SODIUM CITRATE 334-500 MG/5ML PO SOLN: 30.0000 mL | ORAL | Status: DC | PRN

## 2014-05-09 MED ORDER — OXYCODONE-ACETAMINOPHEN 5-325 MG PO TABS: 2.0000 | ORAL_TABLET | ORAL | Status: DC | PRN

## 2014-05-09 MED ORDER — OXYTOCIN 40 UNITS IN LACTATED RINGERS INFUSION - SIMPLE MED
62.5000 mL/h | INTRAVENOUS | Status: DC
  Filled 2014-05-09: qty 1000

## 2014-05-09 MED ORDER — DIPHENHYDRAMINE HCL 50 MG/ML IJ SOLN: 12.5000 mg | INTRAMUSCULAR | Status: DC | PRN

## 2014-05-09 MED ORDER — OXYTOCIN BOLUS FROM INFUSION
500.0000 mL | INTRAVENOUS | Status: DC
  Administered 2014-05-10: 500 mL via INTRAVENOUS

## 2014-05-09 MED ORDER — ONDANSETRON HCL 4 MG/2ML IJ SOLN
4.0000 mg | Freq: Four times a day (QID) | INTRAMUSCULAR | Status: DC | PRN
Start: 2014-05-09 — End: 2014-05-10

## 2014-05-09 NOTE — MAU Note (Signed)
Report called to charge RN on BS. Will go to 165 with nurse transport due to non reactive FHR tracing. Strip reviewed.

## 2014-05-09 NOTE — Progress Notes (Signed)
Notified of pt arrival in MAU, cervical exam, uterine activity and FHR tracing. Will recheck in 1 hr and give pt something sweet to drink

## 2014-05-09 NOTE — H&P (Signed)
Gail Robinson is a 23 y.o. female presenting for contractions.  Denies leaking or bleeding  RN Note: .  Expand All Collapse All   Notified of pt arrival in MAU, cervical exam, uterine activity and FHR tracing. Will recheck in 1 hr and give pt something sweet to drink         Maternal Medical History:  Reason for admission: Nausea.    OB History    Gravida Para Term Preterm AB TAB SAB Ectopic Multiple Living   3 1 1  1 1    1      Past Medical History  Diagnosis Date  . Asthma     childhood   History reviewed. No pertinent past surgical history. Family History: family history is not on file. Social History:  reports that she has quit smoking. Her smoking use included Cigarettes. She smoked 0.30 packs per day. She has never used smokeless tobacco. She reports that she does not drink alcohol or use illicit drugs.   Prenatal Transfer Tool  Maternal Diabetes: No Genetic Screening: Normal Maternal Ultrasounds/Referrals: Normal Fetal Ultrasounds or other Referrals:  None Maternal Substance Abuse:  Marijuana throughout pregnancy Significant Maternal Medications:  None Significant Maternal Lab Results:  None Other Comments:  None  Review of Systems  Constitutional: Negative for fever, chills and malaise/fatigue.  Gastrointestinal: Positive for abdominal pain. Negative for nausea, vomiting, diarrhea and constipation.  Neurological: Negative for dizziness.    Dilation: 4.5 Effacement (%): 80 Station: -3, -2 Exam by:: Gail Robinson CNM Blood pressure 152/87, pulse 118, last menstrual period 08/04/2013. Maternal Exam:  Uterine Assessment: Contraction strength is moderate.  Contraction frequency is irregular.   Abdomen: Fundal height is 38.   Estimated fetal weight is 7.   Fetal presentation: vertex  Introitus: Normal vulva. Normal vagina.  Vagina is negative for discharge.  Ferning test: not done.  Nitrazine test: not done. Amniotic fluid character: not  assessed.  Pelvis: adequate for delivery.   Cervix: Cervix evaluated by digital exam.     Fetal Exam Fetal Monitor Review: Mode: ultrasound.   Baseline rate: 135.  Variability: minimal (<5 bpm).   Pattern: no accelerations and no decelerations.    Fetal State Assessment: Category II - tracings are indeterminate.     Physical Exam  Constitutional: She is oriented to person, place, and time. She appears well-developed and well-nourished. No distress.  HENT:  Head: Normocephalic.  Cardiovascular: Normal rate.   Respiratory: Effort normal.  GI: Soft. She exhibits no distension. There is no tenderness. There is no rebound and no guarding.  Genitourinary: Vagina normal. No vaginal discharge found.  Dilation: 4.5 Effacement (%): 80 Cervical Position: Posterior Station: -3, -2 Presentation: Vertex Exam by:: Gail Robinson CNM   Musculoskeletal: Normal range of motion.  Neurological: She is alert and oriented to person, place, and time.  Skin: Skin is warm and dry.  Psychiatric: She has a normal mood and affect.    Prenatal labs: ABO, Rh: B/POS/-- (07/02 0953) Antibody: NEG (07/02 0953) Rubella: 5.51 (07/02 0953) RPR: NON REAC (09/30 1605)  HBsAg: NEGATIVE (07/02 0953)  HIV: NONREACTIVE (09/30 1605)  GBS:     Assessment/Plan: A:  SIUP at 3725w4d       Active labor  [facility]       Routine orders       Epidural PRN       Drug screen   [facility] CenterWILLIAMS,Dalila Arca 05/09/2014, 9:21 PM

## 2014-05-09 NOTE — Anesthesia Preprocedure Evaluation (Signed)
Anesthesia Evaluation  Patient identified by MRN, date of birth, ID band Patient awake    Reviewed: Allergy & Precautions, H&P , NPO status , Patient's Chart, lab work & pertinent test results  Airway Mallampati: I TM Distance: >3 FB Neck ROM: full    Dental no notable dental hx.    Pulmonary former smoker,    Pulmonary exam normal       Cardiovascular negative cardio ROS      Neuro/Psych negative neurological ROS  negative psych ROS   GI/Hepatic negative GI ROS, Neg liver ROS,   Endo/Other  negative endocrine ROS  Renal/GU negative Renal ROS     Musculoskeletal   Abdominal Normal abdominal exam  (+)   Peds  Hematology negative hematology ROS (+)   Anesthesia Other Findings   Reproductive/Obstetrics (+) Pregnancy                           Anesthesia Physical Anesthesia Plan  ASA: II  Anesthesia Plan: Epidural   Post-op Pain Management:    Induction:   Airway Management Planned:   Additional Equipment:   Intra-op Plan:   Post-operative Plan:   Informed Consent: I have reviewed the patients History and Physical, chart, labs and discussed the procedure including the risks, benefits and alternatives for the proposed anesthesia with the patient or authorized representative who has indicated his/her understanding and acceptance.     Plan Discussed with:   Anesthesia Plan Comments:         Anesthesia Quick Evaluation  

## 2014-05-09 NOTE — Anesthesia Procedure Notes (Signed)
Epidural Patient location during procedure: OB Start time: 05/09/2014 10:28 PM End time: 05/09/2014 10:32 PM  Staffing Anesthesiologist: Leilani AbleHATCHETT, Theodosia Bahena Performed by: anesthesiologist   Preanesthetic Checklist Completed: patient identified, surgical consent, pre-op evaluation, timeout performed, IV checked, risks and benefits discussed and monitors and equipment checked  Epidural Patient position: sitting Prep: site prepped and draped and DuraPrep Patient monitoring: continuous pulse ox and blood pressure Approach: midline Location: L3-L4 Injection technique: LOR air  Needle:  Needle type: Tuohy  Needle gauge: 17 G Needle length: 9 cm and 9 Needle insertion depth: 8 cm Catheter type: closed end flexible Catheter size: 19 Gauge Catheter at skin depth: 13 cm Test dose: negative and Other  Assessment Sensory level: T9 Events: blood not aspirated, injection not painful, no injection resistance, negative IV test and no paresthesia  Additional Notes Reason for block:procedure for pain

## 2014-05-10 ENCOUNTER — Encounter: Payer: Medicaid Other | Admitting: Family Medicine

## 2014-05-10 ENCOUNTER — Encounter (HOSPITAL_COMMUNITY): Payer: Self-pay | Admitting: *Deleted

## 2014-05-10 DIAGNOSIS — Z3A40 40 weeks gestation of pregnancy: Secondary | ICD-10-CM

## 2014-05-10 LAB — RAPID URINE DRUG SCREEN, HOSP PERFORMED
Amphetamines: NOT DETECTED
Barbiturates: NOT DETECTED
Benzodiazepines: NOT DETECTED
COCAINE: NOT DETECTED
OPIATES: NOT DETECTED
TETRAHYDROCANNABINOL: NOT DETECTED

## 2014-05-10 LAB — RPR

## 2014-05-10 LAB — HIV ANTIBODY (ROUTINE TESTING W REFLEX): HIV 1&2 Ab, 4th Generation: NONREACTIVE

## 2014-05-10 MED ORDER — TETANUS-DIPHTH-ACELL PERTUSSIS 5-2.5-18.5 LF-MCG/0.5 IM SUSP: 0.5000 mL | Freq: Once | INTRAMUSCULAR | Status: DC

## 2014-05-10 MED ORDER — WITCH HAZEL-GLYCERIN EX PADS: 1.0000 "application " | MEDICATED_PAD | CUTANEOUS | Status: DC | PRN

## 2014-05-10 MED ORDER — OXYCODONE-ACETAMINOPHEN 5-325 MG PO TABS
1.0000 | ORAL_TABLET | ORAL | Status: DC | PRN
  Administered 2014-05-11 (×2): 1 via ORAL
  Filled 2014-05-10 (×2): qty 1

## 2014-05-10 MED ORDER — ONDANSETRON HCL 4 MG/2ML IJ SOLN: 4.0000 mg | INTRAMUSCULAR | Status: DC | PRN

## 2014-05-10 MED ORDER — ONDANSETRON HCL 4 MG PO TABS: 4.0000 mg | ORAL_TABLET | ORAL | Status: DC | PRN

## 2014-05-10 MED ORDER — DIBUCAINE 1 % RE OINT
1.0000 "application " | TOPICAL_OINTMENT | RECTAL | Status: DC | PRN
  Filled 2014-05-10: qty 28

## 2014-05-10 MED ORDER — LANOLIN HYDROUS EX OINT: TOPICAL_OINTMENT | CUTANEOUS | Status: DC | PRN

## 2014-05-10 MED ORDER — DIPHENHYDRAMINE HCL 25 MG PO CAPS: 25.0000 mg | ORAL_CAPSULE | Freq: Four times a day (QID) | ORAL | Status: DC | PRN

## 2014-05-10 MED ORDER — OXYCODONE-ACETAMINOPHEN 5-325 MG PO TABS: 2.0000 | ORAL_TABLET | ORAL | Status: DC | PRN

## 2014-05-10 MED ORDER — LACTATED RINGERS IV SOLN
INTRAVENOUS | Status: DC
  Administered 2014-05-10: 01:00:00 via INTRAUTERINE

## 2014-05-10 MED ORDER — ZOLPIDEM TARTRATE 5 MG PO TABS: 5.0000 mg | ORAL_TABLET | Freq: Every evening | ORAL | Status: DC | PRN

## 2014-05-10 MED ORDER — PRENATAL MULTIVITAMIN CH
1.0000 | ORAL_TABLET | Freq: Every day | ORAL | Status: DC
  Administered 2014-05-10 – 2014-05-12 (×3): 1 via ORAL
  Filled 2014-05-10 (×3): qty 1

## 2014-05-10 MED ORDER — IBUPROFEN 600 MG PO TABS
600.0000 mg | ORAL_TABLET | Freq: Four times a day (QID) | ORAL | Status: DC
  Administered 2014-05-10 – 2014-05-12 (×10): 600 mg via ORAL
  Filled 2014-05-10 (×10): qty 1

## 2014-05-10 MED ORDER — BENZOCAINE-MENTHOL 20-0.5 % EX AERO
1.0000 "application " | INHALATION_SPRAY | CUTANEOUS | Status: DC | PRN
  Administered 2014-05-10: 1 via TOPICAL
  Filled 2014-05-10 (×2): qty 56

## 2014-05-10 MED ORDER — SIMETHICONE 80 MG PO CHEW: 80.0000 mg | CHEWABLE_TABLET | ORAL | Status: DC | PRN

## 2014-05-10 MED ORDER — SENNOSIDES-DOCUSATE SODIUM 8.6-50 MG PO TABS
2.0000 | ORAL_TABLET | ORAL | Status: DC
  Administered 2014-05-10 – 2014-05-11 (×2): 2 via ORAL
  Filled 2014-05-10 (×2): qty 2

## 2014-05-10 NOTE — Plan of Care (Signed)
Problem: Phase I Progression Outcomes Goal: Voiding adequately Outcome: Completed/Met Date Met:  05/10/14

## 2014-05-10 NOTE — Progress Notes (Addendum)
Patient ID: Gail Robinson, female   DOB: 04/10/1991, 23 y.o.   MRN: [id number] Comfortable with epidural but feeling pressure Filed Vitals:   05/09/14 2247 05/09/14 2252 05/09/14 2257 05/09/14 2302  BP: 100/54 95/50 82/55  101/53  Pulse: 101 94 107 89  Temp:      TempSrc:      Resp: 18 18 18 18   Height:      Weight:      SpO2: 100% 100% 100% 100%   FHR equivocal, one prolonged variable decel to 60-80s.  Then variability decreased and some late decels  AROM bloody fluid IUPC and ISE inserted  WIll try amnioinfusion and let Dr Despina HiddenEure know

## 2014-05-10 NOTE — Plan of Care (Signed)
Problem: Discharge Progression Outcomes Goal: Complications resolved/controlled Outcome: Completed/Met Date Met:  05/10/14 Goal: Pain controlled with appropriate interventions Outcome: Completed/Met Date Met:  05/10/14 Goal: MMR given as ordered Outcome: Not Applicable Date Met:  92/95/74 Goal: Discharge plan in place and appropriate Outcome: Completed/Met Date Met:  05/10/14

## 2014-05-10 NOTE — Progress Notes (Signed)
Clinical Social Work Department PSYCHOSOCIAL ASSESSMENT - MATERNAL/CHILD 05/10/2014  Patient:  Gail Robinson, Gail Robinson  Account Number:  [id number]  Admit Date:  05/09/2014  Ardine Eng Name:   Gail Robinson   Clinical Social Worker:  Lucita Ferrara, CLINICAL SOCIAL WORKER   Date/Time:  05/10/2014 10:20 AM  Date Referred:  05/10/2014   Referral source  Central Nursery     Referred reason  Substance Abuse   Other referral source:    I:  FAMILY / HOME ENVIRONMENT Child's legal guardian:  PARENT  Guardian - Name Guardian - Age Guardian - Address  Gail Robinson [address] [place], [place] [address]   Other household support members/support persons Name Relationship DOB   GRAND MOTHER    SON 2011   Other support:   MOB stated that she lives with her grandmother and that she is supportive.  She also identified her aunt as a support person. She provided consent for the aunt to be present for CSW visit.    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  MOB's UDS positive in July for [facility]. MOB's UDS negative on 11/30.  Baby's UDS and MDS are pending.    V  SOCIAL WORK ASSESSMENT CSW met with the MOB due to history  of THC use.  MOB presented as difficult to engage as she was guarded and minimally answered questions.  CSW noted that MOB appeared tired and offered to return at a different time; however, the MOB stated, "I might as well get this [CSW visit] over with".  MOB displayed an appropriate range in affect.  At end of the visit, MOB denied questions or concerns, and acknowledged CSW availability while at the hospital.    MOB processed minimally her thoughts and feelings as she transitions into the postpartum period.  She shared that she is well supported by family, friends, and her employer.  MOB denied presence of any acute stressors that may negatively impact her transition into the postpartum period.  She denied any concerns about role transition for her older child. MOB denied mental health history, and denied history of postpartum depression.  She acknowledged education on postpartum depression, but stated that she is not concerned about developing symptoms.   MOB acknowledged THC use during pregnancy.  She stated that she was using THC about one time per week until she learned that she was pregnant.  She denied any other substance use.  MOB denied questions or concerns about hospital drug screen policy, and acknowledged need to collect urine and meconium for the baby.   No barriers to discharge.   VI SOCIAL WORK PLAN Social Work Plan  No Further Intervention Required / No Barriers to Discharge  Patient/Family Education   Type of pt/family education:   Postpartum depression  Hospital drug screen policy   If child protective services report - county:   If child protective services report - date:   Information/referral to community resources comment:   No referrals needed at this time.   Other social work plan:   CSW to follow-up PRN.  CSW to monitor UDS and MDS and will make CPS report if warranted.

## 2014-05-10 NOTE — Plan of Care (Signed)
Problem: Consults Goal: Skin Care Protocol Initiated - if Braden Score 18 or less If consults are not indicated, leave blank or document N/A  Outcome: Completed/Met Date Met:  05/10/14  Problem: Phase I Progression Outcomes Goal: VS, stable, temp < 100.4 degrees F Outcome: Completed/Met Date Met:  05/10/14 Goal: Initial discharge plan identified Outcome: Completed/Met Date Met:  05/10/14  Problem: Phase II Progression Outcomes Goal: Pain controlled on oral analgesia Outcome: Completed/Met Date Met:  05/10/14 Goal: Progress activity as tolerated unless otherwise ordered Outcome: Completed/Met Date Met:  05/10/14

## 2014-05-10 NOTE — Plan of Care (Signed)
Problem: Consults Goal: Postpartum Patient Education (See Patient Education module for education specifics.)  Outcome: Completed/Met Date Met:  05/10/14  Problem: Phase I Progression Outcomes Goal: Pain controlled with appropriate interventions Outcome: Completed/Met Date Met:  05/10/14 Goal: OOB as tolerated unless otherwise ordered Outcome: Completed/Met Date Met:  05/10/14

## 2014-05-10 NOTE — Plan of Care (Signed)
Problem: Phase I Progression Outcomes Goal: Other Phase I Outcomes/Goals Outcome: Completed/Met Date Met:  05/10/14  Problem: Phase II Progression Outcomes Goal: Afebrile, VS remain stable Outcome: Completed/Met Date Met:  05/10/14 Goal: Rh isoimmunization per orders Outcome: Completed/Met Date Met:  05/10/14 Goal: Tolerating diet Outcome: Completed/Met Date Met:  05/10/14

## 2014-05-10 NOTE — Plan of Care (Signed)
Problem: Discharge Progression Outcomes Goal: Tolerating diet Outcome: Completed/Met Date Met:  05/10/14     

## 2014-05-10 NOTE — Anesthesia Postprocedure Evaluation (Signed)
Anesthesia Post Note  Patient: Gail Robinson  Procedure(s) Performed: * No procedures listed *  Anesthesia type: Epidural  Patient location: Mother/Baby  Post pain: Pain level controlled  Post assessment: Post-op Vital signs reviewed  Last Vitals:  Filed Vitals:   05/10/14 0510  BP: 110/53  Pulse: 101  Temp: 37.2 C  Resp: 20    Post vital signs: Reviewed  Level of consciousness:alert  Complications: No apparent anesthesia complications

## 2014-05-10 NOTE — Plan of Care (Signed)
Problem: Phase II Progression Outcomes Goal: Other Phase II Outcomes/Goals Outcome: Completed/Met Date Met:  05/10/14     

## 2014-05-10 NOTE — Lactation Note (Signed)
This note was copied from the chart of Gail Darrol Jumpntoinette Quilling. Lactation Consultation Note  Experienced BF mother.  Baby was on her lap and was cueing to eat.  Mother reports that feeding cues were taught but did not offer any evidence of.  Mom was not well engaged with me so I showed her where the feeding cue card so that she could refer to it.  She agreed to feed the baby.  Baby has a shallow latch and is chomping.  I was able to get her to loosen up her mouth with tongue exercises and jaw massage.  She latched somewhat deeper but it was still shallow.  Mother reported increased comfort.  Most of the baby's suckling was non-nutritive but she had just come off the breast prior to consult.  I explained to use an asymmetrical latch and to watch for long jaw excursions.  Mom was not very interactive with me so I asked her to call for help when she needs it.  Visitor was suggesting formula to the baby in a "playful way".  I explained to visitor that formula contributes to asthma and allergies. Follow-up tomorrow.  Patient Name: Gail Darrol Jumpntoinette Dollins RUEAV'WToday's Date: 05/10/2014     Maternal Data Has patient been taught Hand Expression?: No (reports knowing how) Does the patient have breastfeeding experience prior to this delivery?: Yes  Feeding Feeding Type: Breast Fed Length of feed: 30 min  LATCH Score/Interventions Latch: Repeated attempts needed to sustain latch, nipple held in mouth throughout feeding, stimulation needed to elicit sucking reflex. Intervention(s): Adjust position;Assist with latch;Breast compression  Audible Swallowing: None  Type of Nipple: Everted at rest and after stimulation  Comfort (Breast/Nipple): Filling, red/small blisters or bruises, mild/mod discomfort     Hold (Positioning): Assistance needed to correctly position infant at breast and maintain latch.  LATCH Score: 5  Lactation Tools Discussed/Used     Consult Status Consult Status: Follow-up Date:  05/11/14 Follow-up type: In-patient    Soyla DryerJoseph, Mireya Meditz 05/10/2014, 1:56 PM

## 2014-05-11 NOTE — Lactation Note (Signed)
This note was copied from the chart of Girl Darrol Jumpntoinette Spainhour. Lactation Consultation Note      Follow up consult with this mom and baby, now 5041 hours old, and full term. Mom reports her milk transitioning in. She c/o sore nipples. i assisted mom with latching in cross cradle hold with asymetrical latch. Baby latched deeply with strong, rhythmic suckles. Mom reports this latch very comfortable. I told mom she should not breast feed her newborn as she did her 7118 month old. She laughed and understood  that she needed to better guide this baby, and support her breast, and use cross cradle as opposed to cradle, to protect her nipples. Mom knows to call for questions/conserns.   Patient Name: Girl Darrol Jumpntoinette Job XBJYN'WToday's Date: 05/11/2014 Reason for consult: Follow-up assessment   Maternal Data    Feeding Feeding Type: Breast Fed Length of feed: 15 min  LATCH Score/Interventions Latch: Grasps breast easily, tongue down, lips flanged, rhythmical sucking. Intervention(s): Adjust position;Assist with latch;Breast compression  Audible Swallowing: A few with stimulation  Type of Nipple: Everted at rest and after stimulation  Comfort (Breast/Nipple): Filling, red/small blisters or bruises, mild/mod discomfort  Problem noted: Mild/Moderate discomfort Interventions (Mild/moderate discomfort): Hand expression  Hold (Positioning): Assistance needed to correctly position infant at breast and maintain latch. Intervention(s): Breastfeeding basics reviewed;Support Pillows;Position options  LATCH Score: 7  Lactation Tools Discussed/Used     Consult Status Consult Status: Follow-up Date: 05/12/14 Follow-up type: In-patient    Alfred LevinsLee, Zaire Levesque Anne 05/11/2014, 6:54 PM

## 2014-05-11 NOTE — Progress Notes (Signed)
Post Partum Day 1 Subjective: no complaints, up ad lib, voiding and tolerating PO  Objective: Blood pressure 112/54, pulse 71, temperature 98 F (36.7 C), temperature source Oral, resp. rate 18, height 5\' 7"  (1.702 m), weight 92.987 kg (205 lb), last menstrual period 08/04/2013, SpO2 99 %, unknown if currently breastfeeding.  Physical Exam:  General: alert, cooperative and no distress Lochia: appropriate Uterine Fundus: firm DVT Evaluation: Negative Homan's sign.    Recent Labs  05/09/14 2040  HGB 13.8  HCT 41.0    Assessment/Plan: Plan for discharge tomorrow, Breastfeeding and Contraception Mirena    LOS: 2 days   Karma LewFong, Nirvaan Frett K 05/11/2014, 7:33 AM

## 2014-05-12 NOTE — Discharge Summary (Signed)
Obstetric Discharge Summary Reason for Admission: onset of labor Prenatal Procedures: NST and ultrasound Intrapartum Procedures: spontaneous vaginal delivery Postpartum Procedures: none Complications-Operative and Postpartum: none  Delivery Note At 1:38 AM a viable and healthy female was delivered via Vaginal, Spontaneous Delivery (Presentation: Left Occiput Anterior).  APGAR: 8, 9; weight 6 lb 8.9 oz (2974 g).   Placenta status: Intact, Spontaneous.  Cord: 3 vessels with the following complications: None.    Anesthesia: Epidural  Episiotomy: None Lacerations: None Est. Blood Loss (mL): 250  Mom to postpartum.  Baby to Couplet care / Skin to Skin.    Hospital Course:  Active Problems:   History of fetal anomaly in prior pregnancy, currently pregnant   Active labor at term   Gail Robinson is a 23 y.o. [id number] s/p SVD.  Patient presented to [facility] with contractions  and was admitted to L&D.  She has postpartum course that was uncomplicated including no problems with ambulating, PO intake, urination, pain, or bleeding. The pt feels ready to go home and  will be discharged with outpatient follow-up.   Today: No acute events overnight.  Pt denies problems with ambulating, voiding or po intake.  She denies nausea or vomiting.  Pain is well controlled.  She has had flatus. She has had bowel movement.  Lochia Minimal.  Plan for birth control is  IUD.  Method of Feeding: Breast  H/H: Lab Results  Component Value Date/Time   HGB 13.8 05/09/2014 08:40 PM   HCT 41.0 05/09/2014 08:40 PM    Discharge Diagnoses: Term Pregnancy-delivered  Discharge Information: Date: 05/12/2014 Activity: pelvic rest Diet: routine  Medications: None Breast feeding:  Yes Condition: stable Instructions: refer to handout Discharge to: home   Discharge Instructions    Call MD for:  severe uncontrolled pain    Complete by:  As directed      Call MD for:  temperature >100.4    Complete by:  As  directed      Diet - low sodium heart healthy    Complete by:  As directed             Medication List    TAKE these medications        albuterol 108 (90 BASE) MCG/ACT inhaler  Commonly known as:  PROVENTIL HFA;VENTOLIN HFA  Inhale 1-2 puffs into the lungs every 6 (six) hours as needed for wheezing or shortness of breath.     albuterol (2.5 MG/3ML) 0.083% nebulizer solution  Commonly known as:  PROVENTIL  Take 3 mLs (2.5 mg total) by nebulization every 6 (six) hours as needed for wheezing.     beclomethasone 80 MCG/ACT inhaler  Commonly known as:  QVAR  Inhale 2 puffs into the lungs 2 (two) times daily.     prenatal vitamin w/FE, FA 27-1 MG Tabs tablet  Take 1 tablet by mouth daily at 12 noon.           Follow-up Information    Follow up with [facility] In 6 weeks.   Contact information:   [address] [place] [id number] [phone]       HEMOGLOBIN  Date Value Ref Range Status  05/09/2014 13.8 12.0 - 15.0 g/dL Final   HCT  Date Value Ref Range Status  05/09/2014 41.0 36.0 - 46.0 % Final    Physical Exam:  General: alert, cooperative and no distress Lochia: appropriate Uterine Fundus: firm DVT Evaluation: No evidence of DVT seen on physical exam.  Negative Homan's sign.   Follow-up Information    Follow up with [facility] In 6 weeks.   Contact information:   [address] [place] [id number] [phone]      Newborn Data: Live born female  Birth Weight: 6 lb 8.9 oz (2974 g) APGAR: 8, 9  Home with mother.  Laymond PurserFong, Navayah Sok K 05/12/2014, 7:42 AM

## 2014-05-12 NOTE — Lactation Note (Signed)
This note was copied from the chart of Gail Robinson. Lactation Consultation Note  Baby and mom to be discharged today.  Baby is only down 4 % weight and she is having heavy wet diapers.  Mom states breasts are becoming full.  No questions at present.  Outpatient lactation services encouraged prn.  Patient Name: Gail Darrol Jumpntoinette Salsbury ZOXWR'UToday's Date: 05/12/2014     Maternal Data    Feeding Feeding Type: Breast Fed Length of feed: 60 min  LATCH Score/Interventions                      Lactation Tools Discussed/Used     Consult Status      Huston FoleyMOULDEN, Salia Cangemi S 05/12/2014, 10:30 AM

## 2014-05-12 NOTE — Plan of Care (Signed)
Problem: Discharge Progression Outcomes Goal: Barriers To Progression Addressed/Resolved Outcome: Completed/Met Date Met:  05/12/14 Goal: Activity appropriate for discharge plan Outcome: Completed/Met Date Met:  05/12/14 Goal: Afebrile, VS remain stable at discharge Outcome: Completed/Met Date Met:  05/12/14 Goal: Other Discharge Outcomes/Goals Outcome: Completed/Met Date Met:  05/12/14

## 2014-05-12 NOTE — Discharge Instructions (Signed)

## 2014-06-16 ENCOUNTER — Ambulatory Visit (INDEPENDENT_AMBULATORY_CARE_PROVIDER_SITE_OTHER): Payer: Medicaid Other | Admitting: Obstetrics & Gynecology

## 2014-06-16 ENCOUNTER — Encounter: Payer: Self-pay | Admitting: Obstetrics & Gynecology

## 2014-06-16 VITALS — BP 116/77 | HR 78 | Temp 98.9°F | Ht 66.0 in | Wt 197.8 lb

## 2014-06-16 DIAGNOSIS — Z01812 Encounter for preprocedural laboratory examination: Secondary | ICD-10-CM

## 2014-06-16 DIAGNOSIS — Z30430 Encounter for insertion of intrauterine contraceptive device: Secondary | ICD-10-CM

## 2014-06-16 MED ORDER — ETONOGESTREL 68 MG ~~LOC~~ IMPL
68.0000 mg | DRUG_IMPLANT | Freq: Once | SUBCUTANEOUS | Status: AC
  Administered 2014-06-16: 68 mg via SUBCUTANEOUS

## 2014-06-16 NOTE — Progress Notes (Signed)
  Subjective:     Gail Robinson is a 24 y.o. female who presents for a postpartum visit. She is 5 weeks postpartum following a spontaneous vaginal delivery. I have fully reviewed the prenatal and intrapartum course. The delivery was at 40 gestational weeks. Outcome: spontaneous vaginal delivery. Anesthesia: epidural. Postpartum course has been uncomplicated. Baby's course has been uncomplicated. Baby is feeding by breast. Bleeding no bleeding. Bowel function is normal. Bladder function is normal. Patient is not sexually active. Contraception method is Nexplanon. Postpartum depression screening: negative.  The following portions of the patient's history were reviewed and updated as appropriate: allergies, current medications, past family history, past medical history, past social history, past surgical history and problem list.  Review of Systems A comprehensive review of systems was negative.   Objective:    BP 116/77 mmHg  Pulse 78  Temp(Src) 98.9 F (37.2 C) (Oral)  Ht 5\' 6"  (1.676 m)  Wt 197 lb 12.8 oz (89.721 kg)  BMI 31.94 kg/m2  Breastfeeding? Yes   UPT was negative. Consent was signed. Time out procedure was done. Her left arm was prepped with betadine and infiltrated with 3 cc of 1% lidocaine. After adequate anesthesia was assured, the Nexplanon device was placed according to standard of care. Her arm was hemostatic and was bandaged. She tolerated the procedure well.  General:  alert   Breasts:  inspection negative, no nipple discharge or bleeding, no masses or nodularity palpable  Lungs: clear to auscultation bilaterally  Heart:  regular rate and rhythm, S1, S2 normal, no murmur, click, rub or gallop  Abdomen: soft, non-tender; bowel sounds normal; no masses,  no organomegaly   Vulva:  normal  Vagina: not evaluated  Cervix:  anteverted  Corpus: normal  Adnexa:  not evaluated  Rectal Exam: Not performed.        Assessment:     Normal postpartum exam. Pap smear  not done at today's visit.   Plan:     Contraception: Nexplanon   Follow up in: 1 year or as needed.

## 2014-06-16 NOTE — Addendum Note (Signed)
Addended by: Sherre LainASH, Chandy Tarman A on: 06/16/2014 03:39 PM   Modules accepted: Orders

## 2014-06-16 NOTE — Progress Notes (Signed)
Patient here for PP visit. Desires Nexplanon for contraception. Patient states she has not had any intercourse since 2 weeks before delivery. UPT obtained.

## 2014-06-17 LAB — POCT PREGNANCY, URINE: Preg Test, Ur: NEGATIVE

## 2014-06-18 ENCOUNTER — Encounter: Payer: Self-pay | Admitting: *Deleted

## 2015-10-10 IMAGING — CR DG CHEST 2V
2 series · 2 of 2 positions shown · non-contrast
Comparison: None.

CLINICAL DATA: Cough.  Hemoptysis.  Asthma.

EXAM:
CHEST  2 VIEW

[view not recorded (1 of 2)]
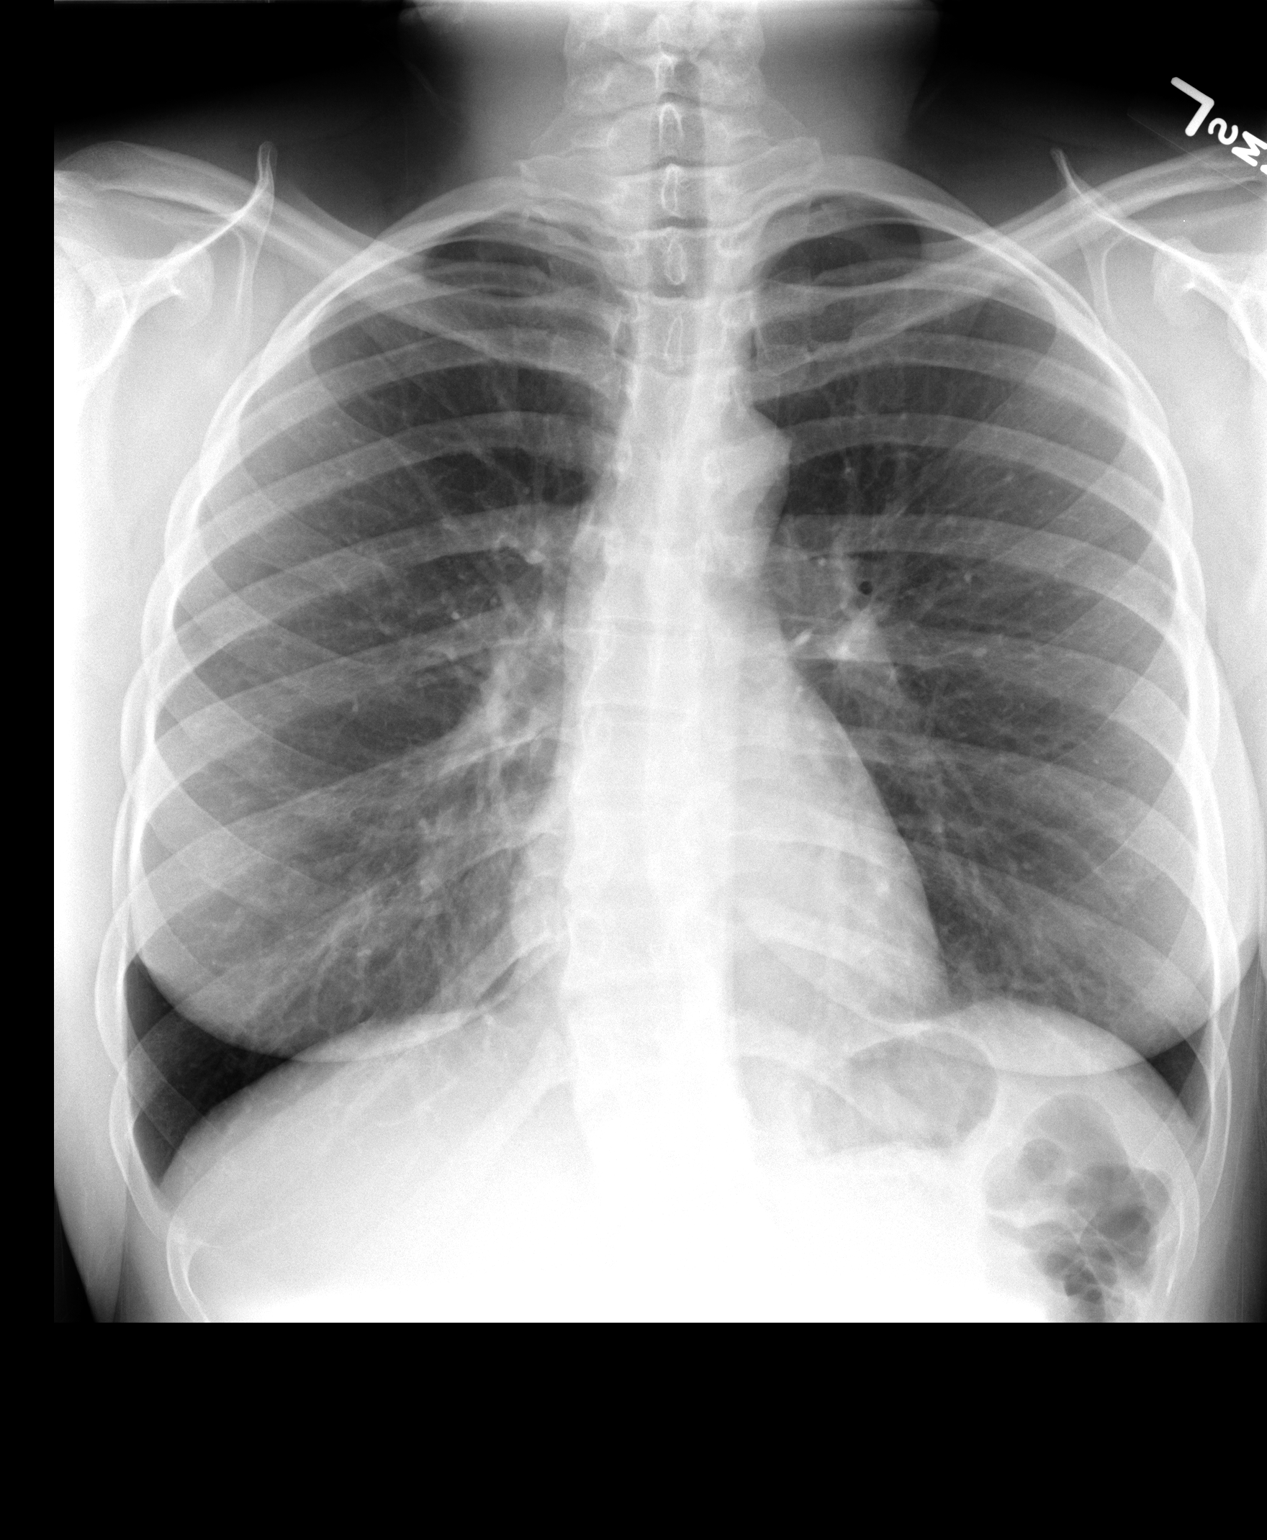

[view not recorded (2 of 2)]
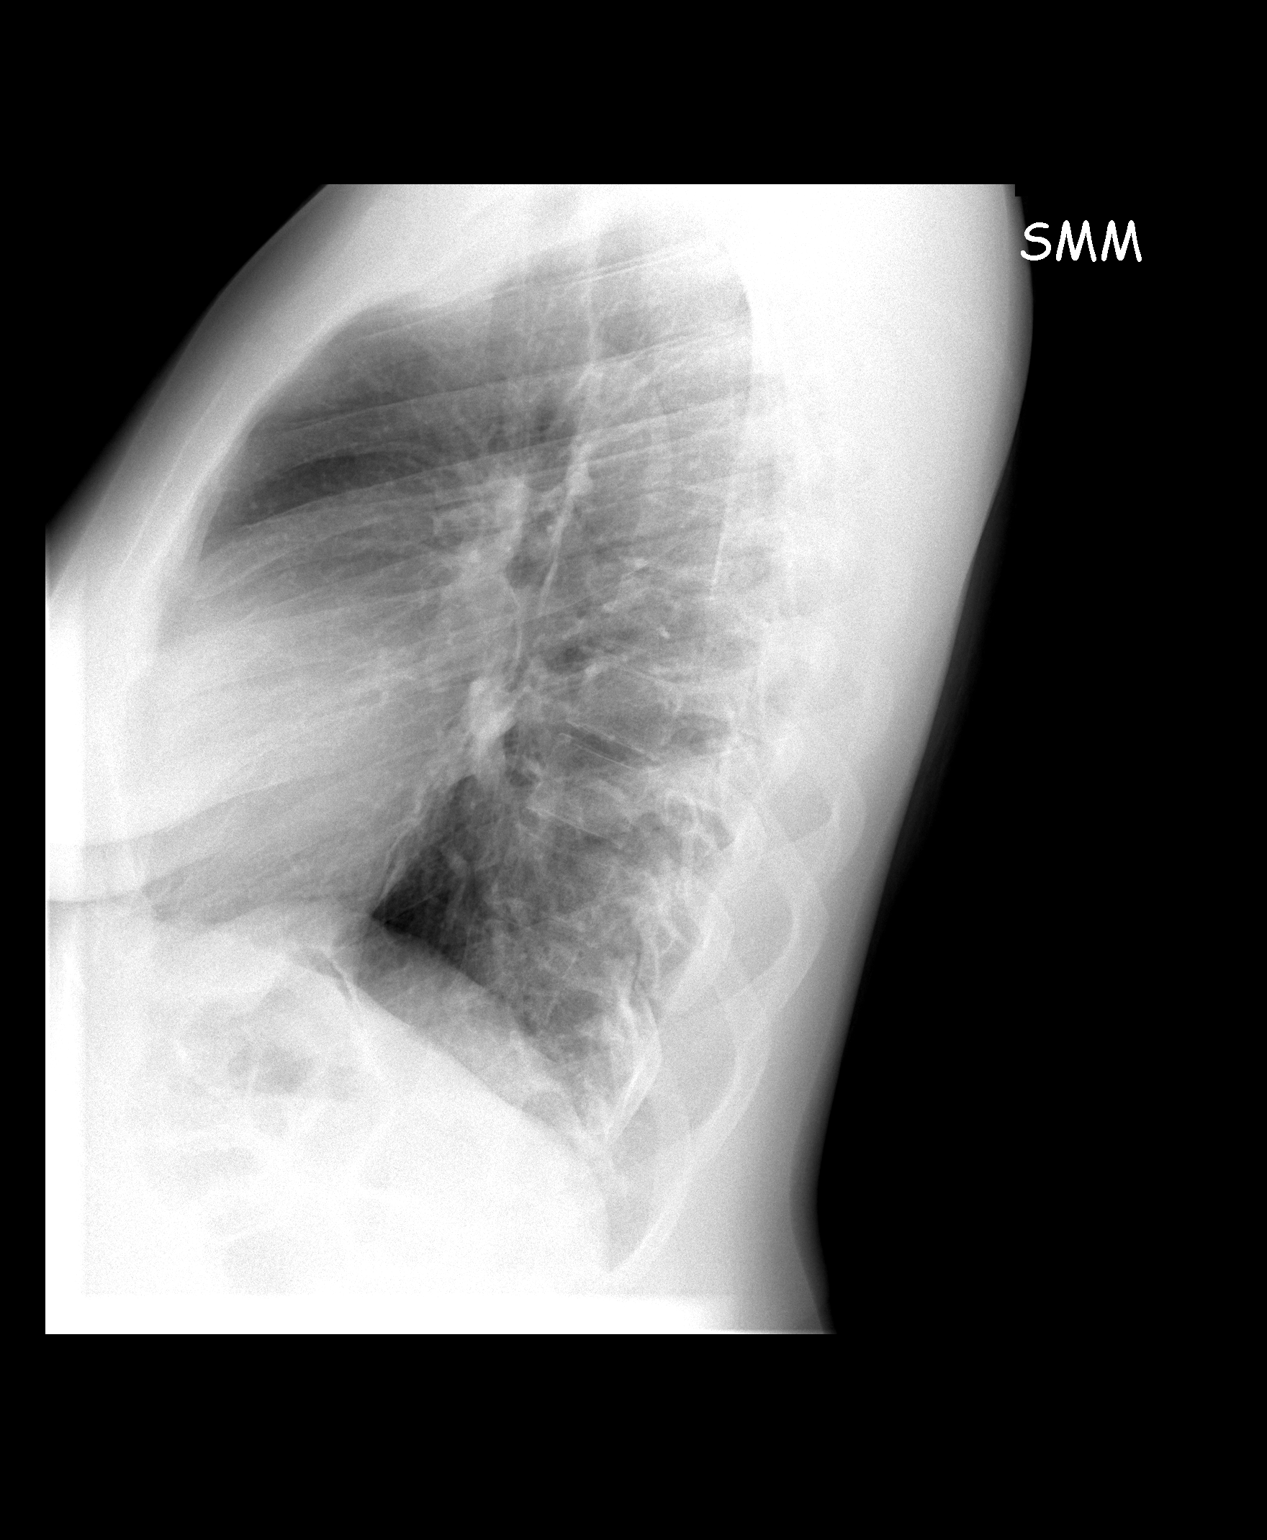

[2 of 2 positions shown; findings below may reference images not displayed]

FINDINGS: The heart size and mediastinal contours are within normal limits.
Both lungs are clear. Mild pulmonary hyperinflation is demonstrated.
Mild thoracic dextroscoliosis also seen.
IMPRESSION: No active cardiopulmonary disease.

## 2017-11-03 ENCOUNTER — Encounter (HOSPITAL_BASED_OUTPATIENT_CLINIC_OR_DEPARTMENT_OTHER): Payer: Self-pay | Admitting: Emergency Medicine

## 2017-11-03 ENCOUNTER — Emergency Department (HOSPITAL_BASED_OUTPATIENT_CLINIC_OR_DEPARTMENT_OTHER)
Admission: EM | Admit: 2017-11-03 | Discharge: 2017-11-03 | Disposition: A | Discharge status: Home / Self Care | Payer: Medicaid Other | Attending: Emergency Medicine | Admitting: Emergency Medicine

## 2017-11-03 ENCOUNTER — Emergency Department (HOSPITAL_BASED_OUTPATIENT_CLINIC_OR_DEPARTMENT_OTHER): Payer: Medicaid Other

## 2017-11-03 ENCOUNTER — Other Ambulatory Visit: Payer: Self-pay

## 2017-11-03 DIAGNOSIS — Y92018 Other place in single-family (private) house as the place of occurrence of the external cause: Secondary | ICD-10-CM | POA: Diagnosis not present

## 2017-11-03 DIAGNOSIS — S89301A Unspecified physeal fracture of lower end of right fibula, initial encounter for closed fracture: Secondary | ICD-10-CM | POA: Diagnosis not present

## 2017-11-03 DIAGNOSIS — J45909 Unspecified asthma, uncomplicated: Secondary | ICD-10-CM | POA: Insufficient documentation

## 2017-11-03 DIAGNOSIS — F17210 Nicotine dependence, cigarettes, uncomplicated: Secondary | ICD-10-CM | POA: Diagnosis not present

## 2017-11-03 DIAGNOSIS — X500XXA Overexertion from strenuous movement or load, initial encounter: Secondary | ICD-10-CM | POA: Diagnosis not present

## 2017-11-03 DIAGNOSIS — Y9389 Activity, other specified: Secondary | ICD-10-CM | POA: Insufficient documentation

## 2017-11-03 DIAGNOSIS — S82831A Other fracture of upper and lower end of right fibula, initial encounter for closed fracture: Secondary | ICD-10-CM

## 2017-11-03 DIAGNOSIS — Y999 Unspecified external cause status: Secondary | ICD-10-CM | POA: Insufficient documentation

## 2017-11-03 DIAGNOSIS — S99911A Unspecified injury of right ankle, initial encounter: Secondary | ICD-10-CM | POA: Diagnosis present

## 2017-11-03 MED ORDER — HYDROCODONE-ACETAMINOPHEN 5-325 MG PO TABS
2.0000 | ORAL_TABLET | Freq: Once | ORAL | Status: AC
  Administered 2017-11-03: 2 via ORAL
  Filled 2017-11-03: qty 2

## 2017-11-03 MED ORDER — HYDROCODONE-ACETAMINOPHEN 5-325 MG PO TABS: 1.0000 | ORAL_TABLET | Freq: Four times a day (QID) | ORAL | 0 refills | Status: DC | PRN

## 2017-11-03 MED ORDER — IBUPROFEN 600 MG PO TABS: 600.0000 mg | ORAL_TABLET | Freq: Four times a day (QID) | ORAL | 0 refills | Status: DC | PRN

## 2017-11-03 NOTE — ED Notes (Signed)
Patient was instructed on how to use the crutches and put on the cam walker. CMS was intact before and after cam walker placed. Patient demonstrated how to walk with the cam walker and crutches.

## 2017-11-03 NOTE — ED Notes (Signed)
ED Provider at bedside. 

## 2017-11-03 NOTE — ED Notes (Signed)
Pt given d/c instructions as per chart. Rx x 2 with precautions. Verbalizes understanding. No questions. 

## 2017-11-03 NOTE — Discharge Instructions (Addendum)
Keep your leg elevated whenever you are not walking on it.  Wear your walking boot at all times, however you can take your foot out to wash.  Do not bear weight without the boot.  Use crutches in addition as needed.  Take ibuprofen every 6 hours as needed for mild to moderate pain.  Take 1-2 Norco every 6 hours as needed for severe pain.  Please follow-up with orthopedist below within 1 week.  Please return to the emergency department if you develop any new or worsening symptoms.  Do not drink alcohol, drive, operate machinery or participate in any other potentially dangerous activities while taking opiate pain medication as it may make you sleepy. Do not take this medication with any other sedating medications, either prescription or over-the-counter. If you were prescribed Percocet or Vicodin, do not take these with acetaminophen (Tylenol) as it is already contained within these medications and overdose of Tylenol is dangerous.   This medication is an opiate (or narcotic) pain medication and can be habit forming.  Use it as little as possible to achieve adequate pain control.  Do not use or use it with extreme caution if you have a history of opiate abuse or dependence. This medication is intended for your use only - do not give any to anyone else and keep it in a secure place where nobody else, especially children, have access to it. It will also cause or worsen constipation, so you may want to consider taking an over-the-counter stool softener while you are taking this medication.

## 2017-11-03 NOTE — ED Triage Notes (Signed)
Patient states that she fell off one of her steps earlier today  - she has right ankle pain - swelling noted to her right ankle

## 2017-11-04 NOTE — ED Provider Notes (Signed)
[facility] EMERGENCY DEPARTMENT Provider Note   CSN: [id number] Arrival date & time: 11/03/17  1954     History   Chief Complaint Chief Complaint  Patient presents with  . Ankle Pain    HPI Gail Robinson is a 27 y.o. female with history of asthma who presents with right ankle pain.  Patient reports she was stepping off her porch when her ankle rolled.  She has sharp pains while walking on it.  She has had some associated tingling, but no complete numbness.  No interventions taken prior to arrival.  She did not hit her head or lose consciousness.  She denies any other injuries.  HPI  Past Medical History:  Diagnosis Date  . Asthma    childhood    Patient Active Problem List   Diagnosis Date Noted  . History of fetal anomaly in prior pregnancy, currently pregnant 05/09/2014  . Active labor at term 05/09/2014  . Chlamydia infection 02/24/2014  . Late prenatal care starting at 19 weeks 12/10/2013    History reviewed. No pertinent surgical history.   OB History    Gravida  3   Para  2   Term  2   Preterm      AB  1   Living  2     SAB      TAB  1   Ectopic      Multiple  0   Live Births  2            Home Medications    Prior to Admission medications   Medication Sig Start Date End Date Taking? Authorizing Provider  albuterol (PROVENTIL HFA;VENTOLIN HFA) 108 (90 BASE) MCG/ACT inhaler Inhale 1-2 puffs into the lungs every 6 (six) hours as needed for wheezing or shortness of breath. 11/12/13   Reuben Likes, MD  albuterol (PROVENTIL) (2.5 MG/3ML) 0.083% nebulizer solution Take 3 mLs (2.5 mg total) by nebulization every 6 (six) hours as needed for wheezing. 11/12/13   Reuben Likes, MD  beclomethasone (QVAR) 80 MCG/ACT inhaler Inhale 2 puffs into the lungs 2 (two) times daily. 11/12/13   Reuben Likes, MD  HYDROcodone-acetaminophen (NORCO/VICODIN) 5-325 MG tablet Take 1-2 tablets by mouth every 6 (six) hours as needed. 11/03/17   Selin Eisler,  Waylan Boga, PA-C  ibuprofen (ADVIL,MOTRIN) 600 MG tablet Take 1 tablet (600 mg total) by mouth every 6 (six) hours as needed. 11/03/17   Emi Holes, PA-C  prenatal vitamin w/FE, FA (PRENATAL 1 + 1) 27-1 MG TABS tablet Take 1 tablet by mouth daily at 12 noon. 04/07/14   Bertram Denver, PA-C    Family History History reviewed. No pertinent family history.  Social History Social History   Tobacco Use  . Smoking status: Current Some Day Smoker    Packs/day: 0.30    Types: Cigarettes    Last attempt to quit: 12/23/2013    Years since quitting: 3.8  . Smokeless tobacco: Never Used  Substance Use Topics  . Alcohol use: Yes    Comment: Occ  . Drug use: Yes    Comment: occ     Allergies   Patient has no known allergies.   Review of Systems Review of Systems  Musculoskeletal: Positive for arthralgias.  Neurological: Negative for syncope and numbness (paresthesia only).     Physical Exam Updated Vital Signs BP 115/85 (BP Location: Right Arm)   Pulse 80   Temp 98.7 F (37.1 C) (Oral)  Resp 20   Ht  (1.676 m)   Wt 86.2 kg (190 lb)   LMP 11/02/2017   SpO2 98%   BMI 30.67 kg/m   Physical Exam  Constitutional: She appears well-developed and well-nourished. No distress.  HENT:  Head: Normocephalic and atraumatic.  Mouth/Throat: Oropharynx is clear and moist. No oropharyngeal exudate.  Eyes: Pupils are equal, round, and reactive to light. Conjunctivae are normal. Right eye exhibits no discharge. Left eye exhibits no discharge. No scleral icterus.  Neck: Normal range of motion. Neck supple. No thyromegaly present.  Cardiovascular: Normal rate, regular rhythm, normal heart sounds and intact distal pulses. Exam reveals no gallop and no friction rub.  No murmur heard. Pulmonary/Chest: Effort normal and breath sounds normal. No stridor. No respiratory distress. She has no wheezes. She has no rales.  Abdominal: Soft. Bowel sounds are normal. She exhibits no  distension. There is no tenderness. There is no rebound and no guarding.  Musculoskeletal: She exhibits no edema.       Right ankle: She exhibits decreased range of motion and swelling. She exhibits no deformity and normal pulse. Tenderness. Lateral malleolus and proximal fibula tenderness found. No head of 5th metatarsal tenderness found. Achilles tendon normal.  Lymphadenopathy:    She has no cervical adenopathy.  Neurological: She is alert. Coordination normal.  Skin: Skin is warm and dry. No rash noted. She is not diaphoretic. No pallor.  Psychiatric: She has a normal mood and affect.  Nursing note and vitals reviewed.    ED Treatments / Results  Labs (all labs ordered are listed, but only abnormal results are displayed) Labs Reviewed - No data to display  EKG None  Radiology Dg Tibia/fibula Right  Result Date: 11/03/2017 CLINICAL DATA:  Pain following rolling injury EXAM: RIGHT TIBIA AND FIBULA - 2 VIEW COMPARISON:  Right ankle radiographs Nov 03, 2017 FINDINGS: Frontal and lateral views obtained. There is a fracture of the distal fibular metaphysis-diaphysis junction with alignment near anatomic. No other fractures are demonstrable. No dislocation. Joint spaces appear normal. There is a phleboliths anterior to the junction of the proximal and mid thirds of the tibia. IMPRESSION: Fracture distal fibula in near anatomic alignment described in the ankle report. No other fracture. No dislocation. No appreciable arthropathic change. Electronically Signed   By: Bretta Bang III M.D.   On: 11/03/2017 21:51   Dg Ankle Complete Right  Result Date: 11/03/2017 CLINICAL DATA:  Status post fall.  Ankle pain. EXAM: RIGHT ANKLE - COMPLETE 3+ VIEW COMPARISON:  None. FINDINGS: Nondisplaced oblique fracture of the distal fibular metaphysis with overlying soft tissue swelling. No other fracture or dislocation. Intact ankle mortise. IMPRESSION: Acute nondisplaced fracture of the distal fibular  metaphysis with overlying soft tissue swelling. Electronically Signed   By: Elige Ko   On: 11/03/2017 20:57    Procedures Procedures (including critical care time)  Medications Ordered in ED Medications  HYDROcodone-acetaminophen (NORCO/VICODIN) 5-325 MG per tablet 2 tablet (2 tablets Oral Given 11/03/17 2118)     Initial Impression / Assessment and Plan / ED Course  I have reviewed the triage vital signs and the nursing notes.  Pertinent labs & imaging results that were available during my care of the patient were reviewed by me and considered in my medical decision making (see chart for details).     Patient with acute nondisplaced fracture of the distal fibular metaphysis with overlying soft tissue swelling.  No other fracture, dislocation noted in tib-fib x-ray.  Patient  is neurovascularly intact.  Patient was placed in a cam walker boot and given crutches.  Follow-up to orthopedics for further evaluation and treatment.  Will discharge home with short course of Norco and ibuprofen for pain control.  I reviewed the [place] narcotic database and found no discrepancies.  Return precautions discussed.  Patient understands and agrees with plan.  Patient vitals stable throughout ED course and discharged in satisfactory condition.  Final Clinical Impressions(s) / ED Diagnoses   Final diagnoses:  Other closed fracture of distal end of right fibula, initial encounter    ED Discharge Orders        Ordered    HYDROcodone-acetaminophen (NORCO/VICODIN) 5-325 MG tablet  Every 6 hours PRN     11/03/17 2240    ibuprofen (ADVIL,MOTRIN) 600 MG tablet  Every 6 hours PRN     11/03/17 2240       Emi Holes, PA-C 11/04/17 0016    Arby Barrette, MD 11/12/17 1450

## 2018-04-09 ENCOUNTER — Ambulatory Visit (INDEPENDENT_AMBULATORY_CARE_PROVIDER_SITE_OTHER): Payer: Medicaid Other | Admitting: Student

## 2018-04-09 ENCOUNTER — Encounter: Payer: Self-pay | Admitting: Student

## 2018-04-09 VITALS — BP 120/72 | HR 106 | Wt 202.0 lb

## 2018-04-09 DIAGNOSIS — Z3046 Encounter for surveillance of implantable subdermal contraceptive: Secondary | ICD-10-CM | POA: Diagnosis not present

## 2018-04-09 LAB — POCT PREGNANCY, URINE: Preg Test, Ur: NEGATIVE

## 2018-04-09 NOTE — Patient Instructions (Signed)
Keep pressure bandage on for 24 hrs. Afterwards use bandaid for 3 days. Take tylenol or ibuprofen as needed for pain.     Health Maintenance, Female Adopting a healthy lifestyle and getting preventive care can go a long way to promote health and wellness. Talk with your health care provider about what schedule of regular examinations is right for you. This is a good chance for you to check in with your provider about disease prevention and staying healthy. In between checkups, there are plenty of things you can do on your own. Experts have done a lot of research about which lifestyle changes and preventive measures are most likely to keep you healthy. Ask your health care provider for more information. Weight and diet Eat a healthy diet  Be sure to include plenty of vegetables, fruits, low-fat dairy products, and lean protein.  Do not eat a lot of foods high in solid fats, added sugars, or salt.  Get regular exercise. This is one of the most important things you can do for your health. ? Most adults should exercise for at least 150 minutes each week. The exercise should increase your heart rate and make you sweat (moderate-intensity exercise). ? Most adults should also do strengthening exercises at least twice a week. This is in addition to the moderate-intensity exercise.  Maintain a healthy weight  Body mass index (BMI) is a measurement that can be used to identify possible weight problems. It estimates body fat based on height and weight. Your health care provider can help determine your BMI and help you achieve or maintain a healthy weight.  For females 63 years of age and older: ? A BMI below 18.5 is considered underweight. ? A BMI of 18.5 to 24.9 is normal. ? A BMI of 25 to 29.9 is considered overweight. ? A BMI of 30 and above is considered obese.  Watch levels of cholesterol and blood lipids  You should start having your blood tested for lipids and cholesterol at 28 years of  age, then have this test every 5 years.  You may need to have your cholesterol levels checked more often if: ? Your lipid or cholesterol levels are high. ? You are older than 28 years of age. ? You are at high risk for heart disease.  Cancer screening Lung Cancer  Lung cancer screening is recommended for adults 50-74 years old who are at high risk for lung cancer because of a history of smoking.  A yearly low-dose CT scan of the lungs is recommended for people who: ? Currently smoke. ? Have quit within the past 15 years. ? Have at least a 30-pack-year history of smoking. A pack year is smoking an average of one pack of cigarettes a day for 1 year.  Yearly screening should continue until it has been 15 years since you quit.  Yearly screening should stop if you develop a health problem that would prevent you from having lung cancer treatment.  Breast Cancer  Practice breast self-awareness. This means understanding how your breasts normally appear and feel.  It also means doing regular breast self-exams. Let your health care provider know about any changes, no matter how small.  If you are in your 20s or 30s, you should have a clinical breast exam (CBE) by a health care provider every 1-3 years as part of a regular health exam.  If you are 46 or older, have a CBE every year. Also consider having a breast X-ray (mammogram) every year.  If you have a family history of breast cancer, talk to your health care provider about genetic screening.  If you are at high risk for breast cancer, talk to your health care provider about having an MRI and a mammogram every year.  Breast cancer gene (BRCA) assessment is recommended for women who have family members with BRCA-related cancers. BRCA-related cancers include: ? Breast. ? Ovarian. ? Tubal. ? Peritoneal cancers.  Results of the assessment will determine the need for genetic counseling and BRCA1 and BRCA2 testing.  Cervical  Cancer Your health care provider may recommend that you be screened regularly for cancer of the pelvic organs (ovaries, uterus, and vagina). This screening involves a pelvic examination, including checking for microscopic changes to the surface of your cervix (Pap test). You may be encouraged to have this screening done every 3 years, beginning at age 28.  For women ages 42-65, health care providers may recommend pelvic exams and Pap testing every 3 years, or they may recommend the Pap and pelvic exam, combined with testing for human papilloma virus (HPV), every 5 years. Some types of HPV increase your risk of cervical cancer. Testing for HPV may also be done on women of any age with unclear Pap test results.  Other health care providers may not recommend any screening for nonpregnant women who are considered low risk for pelvic cancer and who do not have symptoms. Ask your health care provider if a screening pelvic exam is right for you.  If you have had past treatment for cervical cancer or a condition that could lead to cancer, you need Pap tests and screening for cancer for at least 20 years after your treatment. If Pap tests have been discontinued, your risk factors (such as having a new sexual partner) need to be reassessed to determine if screening should resume. Some women have medical problems that increase the chance of getting cervical cancer. In these cases, your health care provider may recommend more frequent screening and Pap tests.  Colorectal Cancer  This type of cancer can be detected and often prevented.  Routine colorectal cancer screening usually begins at 28 years of age and continues through 28 years of age.  Your health care provider may recommend screening at an earlier age if you have risk factors for colon cancer.  Your health care provider may also recommend using home test kits to check for hidden blood in the stool.  A small camera at the end of a tube can be used to  examine your colon directly (sigmoidoscopy or colonoscopy). This is done to check for the earliest forms of colorectal cancer.  Routine screening usually begins at age 21.  Direct examination of the colon should be repeated every 5-10 years through 28 years of age. However, you may need to be screened more often if early forms of precancerous polyps or small growths are found.  Skin Cancer  Check your skin from head to toe regularly.  Tell your health care provider about any new moles or changes in moles, especially if there is a change in a mole's shape or color.  Also tell your health care provider if you have a mole that is larger than the size of a pencil eraser.  Always use sunscreen. Apply sunscreen liberally and repeatedly throughout the day.  Protect yourself by wearing long sleeves, pants, a wide-brimmed hat, and sunglasses whenever you are outside.  Heart disease, diabetes, and high blood pressure  High blood pressure causes heart disease and  increases the risk of stroke. High blood pressure is more likely to develop in: ? People who have blood pressure in the high end of the normal range (130-139/85-89 mm Hg). ? People who are overweight or obese. ? People who are African American.  If you are 73-54 years of age, have your blood pressure checked every 3-5 years. If you are 23 years of age or older, have your blood pressure checked every year. You should have your blood pressure measured twice-once when you are at a hospital or clinic, and once when you are not at a hospital or clinic. Record the average of the two measurements. To check your blood pressure when you are not at a hospital or clinic, you can use: ? An automated blood pressure machine at a pharmacy. ? A home blood pressure monitor.  If you are between 67 years and 20 years old, ask your health care provider if you should take aspirin to prevent strokes.  Have regular diabetes screenings. This involves taking a  blood sample to check your fasting blood sugar level. ? If you are at a normal weight and have a low risk for diabetes, have this test once every three years after 28 years of age. ? If you are overweight and have a high risk for diabetes, consider being tested at a younger age or more often. Preventing infection Hepatitis B  If you have a higher risk for hepatitis B, you should be screened for this virus. You are considered at high risk for hepatitis B if: ? You were born in a country where hepatitis B is common. Ask your health care provider which countries are considered high risk. ? Your parents were born in a high-risk country, and you have not been immunized against hepatitis B (hepatitis B vaccine). ? You have HIV or AIDS. ? You use needles to inject street drugs. ? You live with someone who has hepatitis B. ? You have had sex with someone who has hepatitis B. ? You get hemodialysis treatment. ? You take certain medicines for conditions, including cancer, organ transplantation, and autoimmune conditions.  Hepatitis C  Blood testing is recommended for: ? Everyone born from [birth year] through 1966. ? Anyone with known risk factors for hepatitis C.  Sexually transmitted infections (STIs)  You should be screened for sexually transmitted infections (STIs) including gonorrhea and chlamydia if: ? You are sexually active and are younger than 28 years of age. ? You are older than 28 years of age and your health care provider tells you that you are at risk for this type of infection. ? Your sexual activity has changed since you were last screened and you are at an increased risk for chlamydia or gonorrhea. Ask your health care provider if you are at risk.  If you do not have HIV, but are at risk, it may be recommended that you take a prescription medicine daily to prevent HIV infection. This is called pre-exposure prophylaxis (PrEP). You are considered at risk if: ? You are sexually active and  do not regularly use condoms or know the HIV status of your partner(s). ? You take drugs by injection. ? You are sexually active with a partner who has HIV.  Talk with your health care provider about whether you are at high risk of being infected with HIV. If you choose to begin PrEP, you should first be tested for HIV. You should then be tested every 3 months for as long as you are taking  PrEP. Pregnancy  If you are premenopausal and you may become pregnant, ask your health care provider about preconception counseling.  If you may become pregnant, take 400 to 800 micrograms (mcg) of folic acid every day.  If you want to prevent pregnancy, talk to your health care provider about birth control (contraception). Osteoporosis and menopause  Osteoporosis is a disease in which the bones lose minerals and strength with aging. This can result in serious bone fractures. Your risk for osteoporosis can be identified using a bone density scan.  If you are 45 years of age or older, or if you are at risk for osteoporosis and fractures, ask your health care provider if you should be screened.  Ask your health care provider whether you should take a calcium or vitamin D supplement to lower your risk for osteoporosis.  Menopause may have certain physical symptoms and risks.  Hormone replacement therapy may reduce some of these symptoms and risks. Talk to your health care provider about whether hormone replacement therapy is right for you. Follow these instructions at home:  Schedule regular health, dental, and eye exams.  Stay current with your immunizations.  Do not use any tobacco products including cigarettes, chewing tobacco, or electronic cigarettes.  If you are pregnant, do not drink alcohol.  If you are breastfeeding, limit how much and how often you drink alcohol.  Limit alcohol intake to no more than 1 drink per day for nonpregnant women. One drink equals 12 ounces of beer, 5 ounces of  wine, or 1 ounces of hard liquor.  Do not use street drugs.  Do not share needles.  Ask your health care provider for help if you need support or information about quitting drugs.  Tell your health care provider if you often feel depressed.  Tell your health care provider if you have ever been abused or do not feel safe at home. This information is not intended to replace advice given to you by your health care provider. Make sure you discuss any questions you have with your health care provider. Document Released: 12/11/2010 Document Revised: 11/03/2015 Document Reviewed: 03/01/2015 Elsevier Interactive Patient Education  [employer].

## 2018-04-09 NOTE — Progress Notes (Signed)
History:  [name] is a 27 y.o. [id number] who presents to clinic today for nexplanon removal. Nexplanon place 06/2014. Patient does not want replacement contraception.     Patient Active Problem List   Diagnosis Date Noted  . History of fetal anomaly in prior pregnancy, currently pregnant 05/09/2014  . Active labor at term 05/09/2014  . Chlamydia infection 02/24/2014  . Late prenatal care starting at 19 weeks 12/10/2013    No Known Allergies  Current Outpatient Medications on File Prior to Visit  Medication Sig Dispense Refill  . albuterol (PROVENTIL HFA;VENTOLIN HFA) 108 (90 BASE) MCG/ACT inhaler Inhale 1-2 puffs into the lungs every 6 (six) hours as needed for wheezing or shortness of breath. 1 Inhaler 12  . albuterol (PROVENTIL) (2.5 MG/3ML) 0.083% nebulizer solution Take 3 mLs (2.5 mg total) by nebulization every 6 (six) hours as needed for wheezing. 75 mL 12  . prenatal vitamin w/FE, FA (PRENATAL 1 + 1) 27-1 MG TABS tablet Take 1 tablet by mouth daily at 12 noon. 30 each 3  . beclomethasone (QVAR) 80 MCG/ACT inhaler Inhale 2 puffs into the lungs 2 (two) times daily. (Patient not taking: Reported on 04/09/2018) 1 Inhaler 12  . HYDROcodone-acetaminophen (NORCO/VICODIN) 5-325 MG tablet Take 1-2 tablets by mouth every 6 (six) hours as needed. (Patient not taking: Reported on 04/09/2018) 12 tablet 0  . ibuprofen (ADVIL,MOTRIN) 600 MG tablet Take 1 tablet (600 mg total) by mouth every 6 (six) hours as needed. (Patient not taking: Reported on 04/09/2018) 30 tablet 0   No current facility-administered medications on file prior to visit.      The following portions of the patient's history were reviewed and updated as appropriate: allergies, current medications, family history, past medical history, social history, past surgical history and problem list.  Review of Systems:  Other than those mentioned in HPI all ROS negative   Objective:  Physical Exam BP 120/72   Pulse  (!) 106   Wt 202 lb (91.6 kg)   BMI 32.60 kg/m  CONSTITUTIONAL: Well-developed, well-nourished female in no acute distress.  EYES: EOM intact, conjunctivae normal, no scleral icterus HEAD: Normocephalic, atraumatic RESPIRATORY: Effort and breath sounds normal, no problems with respiration noted. SKIN: Skin is warm and dry. No rash noted. Not diaphoretic. No erythema. No pallor. NEUROLGIC: Alert and oriented to person, place, and time. Normal reflexes, muscle tone, coordination. No cranial nerve deficit noted. PSYCHIATRIC: Normal mood and affect. Normal behavior. Normal judgment and thought content.    Marland KitchenNexplanon Removal and Insertion  Patient identified, informed consent performed, consent signed. Pregnancy test in clinic today was negative.  Appropriate time out taken. Nexplanon site identified. Area prepped in usual sterile fashon. One ml of 1% lidocaine was used to anesthetize the area at the distal end of the implant. A small stab incision was made right beside the implant on the distal portion. The Nexplanon rod was grasped using hemostats and removed without difficulty. There was minimal blood loss. There were no complications.There was minimal blood loss. Patient insertion site covered with guaze and a pressure bandage to reduce any bruising. The patient tolerated the procedure well and was given post procedure instructions.     Assessment & Plan:  1. Encounter for Nexplanon removal -f/u with PCP for routine care   Judeth Horn, NP 04/09/2018 6:22 PM

## 2018-04-28 ENCOUNTER — Ambulatory Visit: Payer: Medicaid Other | Admitting: Obstetrics & Gynecology

## 2019-11-20 ENCOUNTER — Telehealth (INDEPENDENT_AMBULATORY_CARE_PROVIDER_SITE_OTHER): Payer: Medicaid Other | Admitting: Lactation Services

## 2019-11-20 DIAGNOSIS — N939 Abnormal uterine and vaginal bleeding, unspecified: Secondary | ICD-10-CM

## 2019-11-20 NOTE — Telephone Encounter (Signed)
Gail Robinson called in with concerns of increased bleeding.   Patient reports she has been having irregular periods. She reports she usually has spotting between periods. She had a period last month that ended around the 17th.   She has unprotected intercourse and has not been pregnant in about 5 years she reports  She reports she had sex last night and she started bleeding like a period. She placed a tampon and when she took it out this morning, she noted bleeding a clots. She reports she took a shower and put on a pad, she reports the bleeding is like a normal period.   We discussed she needs an annual exam and appointment was made for her, she was informed of date and time.   Patient was advised to take a pregnancy test, she wants to take at home, she was offered walk in appt for pregnancy test in the office.   She was informed if she is pregnant and bleeding continues she is to go to the [facility] for evaluation. If she is pregnant and bleeding stops, she can call the office to get scheduled for appointment.  If she is not pregnant and bleeding is noted to soak a pad an hour for 2 hours, then she is to go to the regular ED for evaluation.   Patient reports understanding to all of the above.

## 2019-12-21 ENCOUNTER — Telehealth: Payer: Medicaid Other

## 2019-12-22 ENCOUNTER — Inpatient Hospital Stay (HOSPITAL_COMMUNITY): Payer: Medicaid Other

## 2019-12-22 ENCOUNTER — Other Ambulatory Visit: Payer: Self-pay

## 2019-12-22 ENCOUNTER — Ambulatory Visit (HOSPITAL_COMMUNITY)
Admission: EM | Admit: 2019-12-22 | Discharge: 2019-12-22 | Disposition: A | Discharge status: Home / Self Care | Payer: Medicaid Other | Source: Home / Self Care

## 2019-12-22 ENCOUNTER — Telehealth: Payer: Self-pay | Admitting: Family Medicine

## 2019-12-22 ENCOUNTER — Encounter (HOSPITAL_COMMUNITY): Payer: Self-pay | Admitting: Obstetrics and Gynecology

## 2019-12-22 ENCOUNTER — Inpatient Hospital Stay (HOSPITAL_COMMUNITY)
Admission: AD | Admit: 2019-12-22 | Discharge: 2019-12-22 | Disposition: A | Discharge status: Home / Self Care | Payer: Medicaid Other | Attending: Obstetrics and Gynecology | Admitting: Obstetrics and Gynecology

## 2019-12-22 ENCOUNTER — Ambulatory Visit
Admission: RE | Admit: 2019-12-22 | Discharge: 2019-12-22 | Disposition: A | Discharge status: Home / Self Care | Payer: Medicaid Other | Source: Ambulatory Visit | Attending: Emergency Medicine | Admitting: Emergency Medicine

## 2019-12-22 VITALS — BP 120/77 | HR 101 | Temp 98.7°F | Resp 16

## 2019-12-22 DIAGNOSIS — O4691 Antepartum hemorrhage, unspecified, first trimester: Secondary | ICD-10-CM | POA: Diagnosis not present

## 2019-12-22 DIAGNOSIS — Z7251 High risk heterosexual behavior: Secondary | ICD-10-CM | POA: Diagnosis present

## 2019-12-22 DIAGNOSIS — O209 Hemorrhage in early pregnancy, unspecified: Secondary | ICD-10-CM | POA: Insufficient documentation

## 2019-12-22 DIAGNOSIS — Z8759 Personal history of other complications of pregnancy, childbirth and the puerperium: Secondary | ICD-10-CM | POA: Diagnosis not present

## 2019-12-22 DIAGNOSIS — F17210 Nicotine dependence, cigarettes, uncomplicated: Secondary | ICD-10-CM | POA: Insufficient documentation

## 2019-12-22 DIAGNOSIS — Z3A01 Less than 8 weeks gestation of pregnancy: Secondary | ICD-10-CM

## 2019-12-22 DIAGNOSIS — O99331 Smoking (tobacco) complicating pregnancy, first trimester: Secondary | ICD-10-CM | POA: Diagnosis not present

## 2019-12-22 LAB — POCT URINE PREGNANCY: Preg Test, Ur: POSITIVE — AB

## 2019-12-22 LAB — CBC
HCT: 42.6 % (ref 36.0–46.0)
Hemoglobin: 13.7 g/dL (ref 12.0–15.0)
MCH: 25.7 pg — ABNORMAL LOW (ref 26.0–34.0)
MCHC: 32.2 g/dL (ref 30.0–36.0)
MCV: 79.9 fL — ABNORMAL LOW (ref 80.0–100.0)
Platelets: 225 10*3/uL (ref 150–400)
RBC: 5.33 MIL/uL — ABNORMAL HIGH (ref 3.87–5.11)
RDW: 13.9 % (ref 11.5–15.5)
WBC: 7.5 10*3/uL (ref 4.0–10.5)
nRBC: 0 % (ref 0.0–0.2)

## 2019-12-22 LAB — HCG, QUANTITATIVE, PREGNANCY: hCG, Beta Chain, Quant, S: 30 m[IU]/mL — ABNORMAL HIGH (ref <5)

## 2019-12-22 LAB — URINALYSIS, ROUTINE W REFLEX MICROSCOPIC
Bilirubin Urine: NEGATIVE
Glucose, UA: NEGATIVE mg/dL
Ketones, ur: NEGATIVE mg/dL
Nitrite: NEGATIVE
Protein, ur: NEGATIVE mg/dL
Specific Gravity, Urine: 1.021 (ref 1.005–1.030)
pH: 6 (ref 5.0–8.0)

## 2019-12-22 NOTE — ED Notes (Signed)
Patient able to ambulate independently  

## 2019-12-22 NOTE — Telephone Encounter (Signed)
Patient state she just found out that she is pregnant, she said she want to speak with a nurse because she have been bleeding off and on for the past two months.

## 2019-12-22 NOTE — Discharge Instructions (Addendum)
Take prenatal vitamin once a day. Schedule follow up appointment with OBGYN. Go to [facility] for any abdominal pain, pelvic pain, heavy bleeding.

## 2019-12-22 NOTE — [facility] Note (Signed)
Went to [facility] because she has been bleeding and having a little pain, found out she is pregnant- was told to come  Here to make sure everything is ok.

## 2019-12-22 NOTE — Discharge Instructions (Signed)
Vaginal Bleeding During Pregnancy, First Trimester   A small amount of bleeding (spotting) from the vagina is common during early pregnancy. Sometimes the bleeding is normal and does not cause problems. At other times, though, bleeding may be a sign of something serious. Tell your doctor about any bleeding from your vagina right away. Follow these instructions at home: Activity  Follow your doctor's instructions about how active you can be.  If needed, make plans for someone to help with your normal activities.  Do not have sex or orgasms until your doctor says that this is safe. General instructions  Take over-the-counter and prescription medicines only as told by your doctor.  Watch your condition for any changes.  Write down: ? The number of pads you use each day. ? How often you change pads. ? How soaked (saturated) your pads are.  Do not use tampons.  Do not douche.  If you pass any tissue from your vagina, save it to show to your doctor.  Keep all follow-up visits as told by your doctor. This is important. Contact a doctor if:  You have vaginal bleeding at any time while you are pregnant.  You have cramps.  You have a fever. Get help right away if:  You have very bad cramps in your back or belly (abdomen).  You pass large clots or a lot of tissue from your vagina.  Your bleeding gets worse.  You feel light-headed.  You feel weak.  You pass out (faint).  You have chills.  You are leaking fluid from your vagina.  You have a gush of fluid from your vagina. Summary  Sometimes vaginal bleeding during pregnancy is normal and does not cause problems. At other times, bleeding may be a sign of something serious.  Tell your doctor about any bleeding from your vagina right away.  Follow your doctor's instructions about how active you can be. You may need someone to help you with your normal activities. This information is not intended to replace advice given  to you by your health care provider. Make sure you discuss any questions you have with your health care provider.  Ectopic Pregnancy  An ectopic pregnancy happens when a fertilized egg grows outside the womb (uterus). The fertilized egg cannot stay alive outside of the womb. This problem often happens in a fallopian tube. It is often caused by damage to the tube. If this problem is found early, you may be treated with medicine that stops the egg from growing. If your tube tears or bursts open (ruptures), you will bleed inside. Often, there is very bad pain in the lower belly. This is an emergency. You will need surgery. Get help right away. Follow these instructions at home: After being treated with medicine or surgery:  Rest and limit your activity for as long as told by your doctor.  Until your doctor says that it is safe: ? Do not lift anything that is heavier than 10 lb (4.5 kg) or the limit that your doctor tells you. ? Avoid exercise and any movement that takes a lot of effort.  To prevent problems when pooping (constipation): ? Eat a healthy diet. This includes:  Fruits.  Vegetables.  Whole grains. ? Drink 6-8 glasses of water a day. Contact a doctor if: Get help right away if:  You have sudden and very bad pain in your belly.  You have very bad pain in your shoulders or neck.  You have pain that gets worse and  is not helped by medicine.  You have: ? A fever or chills. ? Vaginal bleeding. ? Redness or swelling at the site of a surgical cut (incision).  You feel sick to your stomach (nauseous) or you throw up (vomit).  You feel dizzy or weak.  You feel light-headed or you pass out (faint). Summary  An ectopic pregnancy happens when a fertilized egg grows outside the womb (uterus).  If this problem is found early, you may be treated with medicine that stops the egg from growing.  If your tube tears or bursts open (ruptures), you will need surgery. This is an  emergency. Get help right away. This information is not intended to replace advice given to you by your health care provider. Make sure you discuss any questions you have with your health care provider. Document Revised: 05/10/2017 Document Reviewed: 06/21/2016 Elsevier Patient Education  2020 Elsevier Inc[employer]   Document Revised: 09/16/2018 Document Reviewed: 08/29/2016 Elsevier Patient Education  2020 [employer].

## 2019-12-22 NOTE — ED Provider Notes (Signed)
[facility]    CSN: [id number] Arrival date & time: 12/22/19  5397      History   Chief Complaint Chief Complaint  Patient presents with  . Vaginal Bleeding    HPI Gail Robinson is a 29 y.o. female with history of asthma in childhood presenting for intermittent vaginal bleeding x3 months.  Patient states she has noted a vaginal odor recently: Requesting STI testing.  Currently sexually reviewed by my partner, not routinely using condoms.  LMP June, the last "normal "period was in April.  States spotting is light pink, more so after sex.  Denies abdominal or pelvic pain, back pain, urinary symptoms such as frequency, urgency.   Past Medical History:  Diagnosis Date  . Asthma    childhood    Patient Active Problem List   Diagnosis Date Noted  . History of fetal anomaly in prior pregnancy, currently pregnant 05/09/2014  . Chlamydia infection 02/24/2014    History reviewed. No pertinent surgical history.  OB History    Gravida  3   Para  2   Term  2   Preterm      AB  1   Living  2     SAB      TAB  1   Ectopic      Multiple  0   Live Births  2            Home Medications    Prior to Admission medications   Medication Sig Start Date End Date Taking? Authorizing Provider  albuterol (PROVENTIL HFA;VENTOLIN HFA) 108 (90 BASE) MCG/ACT inhaler Inhale 1-2 puffs into the lungs every 6 (six) hours as needed for wheezing or shortness of breath. 11/12/13   Reuben Likes, MD  albuterol (PROVENTIL) (2.5 MG/3ML) 0.083% nebulizer solution Take 3 mLs (2.5 mg total) by nebulization every 6 (six) hours as needed for wheezing. 11/12/13   Reuben Likes, MD  prenatal vitamin w/FE, FA (PRENATAL 1 + 1) 27-1 MG TABS tablet Take 1 tablet by mouth daily at 12 noon. 04/07/14   Glyn Ade, Scot Jun, PA-C  beclomethasone (QVAR) 80 MCG/ACT inhaler Inhale 2 puffs into the lungs 2 (two) times daily. Patient not taking: Reported on 04/09/2018 11/12/13 12/22/19   Reuben Likes, MD    Family History History reviewed. No pertinent family history.  Social History Social History   Tobacco Use  . Smoking status: Current Some Day Smoker    Packs/day: 0.30    Types: Cigarettes    Last attempt to quit: 12/23/2013    Years since quitting: 6.0  . Smokeless tobacco: Never Used  Substance Use Topics  . Alcohol use: Yes    Comment: Occ  . Drug use: Yes    Comment: occ     Allergies   Patient has no known allergies.   Review of Systems As per HPI   Physical Exam Triage Vital Signs ED Triage Vitals  Enc Vitals Group     BP      Pulse      Resp      Temp      Temp src      SpO2      Weight      Height      Head Circumference      Peak Flow      Pain Score      Pain Loc      Pain Edu?      Excl. in  GC?    No data found.  Updated Vital Signs BP 120/77 (BP Location: Left Arm)   Pulse (!) 101   Temp 98.7 F (37.1 C) (Oral)   Resp 16   SpO2 98%   Visual Acuity Right Eye Distance:   Left Eye Distance:   Bilateral Distance:    Right Eye Near:   Left Eye Near:    Bilateral Near:     Physical Exam Constitutional:      General: She is not in acute distress. HENT:     Head: Normocephalic and atraumatic.  Eyes:     General: No scleral icterus.    Pupils: Pupils are equal, round, and reactive to light.  Cardiovascular:     Rate and Rhythm: Normal rate.  Pulmonary:     Effort: Pulmonary effort is normal.  Abdominal:     General: Bowel sounds are normal.     Palpations: Abdomen is soft.     Tenderness: There is no abdominal tenderness. There is no right CVA tenderness, left CVA tenderness or guarding.  Genitourinary:    Comments: Patient declined, self-swab performed Skin:    Coloration: Skin is not jaundiced or pale.  Neurological:     Mental Status: She is alert and oriented to person, place, and time.      UC Treatments / Results  Labs (all labs ordered are listed, but only abnormal results are displayed)  Labs Reviewed  POCT URINE PREGNANCY - Abnormal; Notable for the following components:      Result Value   Preg Test, Ur Positive (*)    All other components within normal limits  HIV ANTIBODY (ROUTINE TESTING W REFLEX)  RPR  CERVICOVAGINAL ANCILLARY ONLY    EKG   Radiology No results found.  Procedures Procedures (including critical care time)  Medications Ordered in UC Medications - No data to display  Initial Impression / Assessment and Plan / UC Course  I have reviewed the triage vital signs and the nursing notes.  Pertinent labs & imaging results that were available during my care of the patient were reviewed by me and considered in my medical decision making (see chart for details).     Patient febrile, nontoxic in office today.  No abdominal or pelvic pain.  Cytology pending: We will treat if indicated.  Patient is currently pregnant: Reviewed findings with patient verbalized understanding.  Will start prenatal vitamin, have patient follow-up with her OB/GYN for prenatal care.  Return precautions discussed, patient verbalized understanding and is agreeable to plan. Final Clinical Impressions(s) / UC Diagnoses   Final diagnoses:  Unprotected sex  Less than [redacted] weeks gestation of pregnancy     Discharge Instructions     Take prenatal vitamin once a day. Schedule follow up appointment with OBGYN. Go to [facility] for any abdominal pain, pelvic pain, heavy bleeding.    ED Prescriptions    None     PDMP not reviewed this encounter.   Hall-Potvin, [place], [place] 12/22/19 1055

## 2019-12-22 NOTE — MAU Note (Cosign Needed)
Date: 12/22/2019  MRN:  [id number] Name:  Gail Robinson Sex:  female Age:  29 y.o. DOB:[birth year]    Provider: Kerrie Buffalo, FNP  Emergency Contacts: Contact Information    Name Relation Home Work Mobile   [place] [phone]  [phone]   Michael Litter [phone]        Code Status:Full code Allergies:No Known Allergies   Chief Complaint  Patient presents with  . Vaginal Bleeding  . Abdominal Pain     HPI: Gail Robinson is a 29 y.o. [phone] @ [redacted]w[redacted]d gestation who presents to the MAU with c/o vaginal bleeding. Patient went to Urgent Care today and had a positive urine pregnancy test. She came to MAU for further evaluation. Patient reports LNMP was 5/12 and she has been spotting since then. She is on no birth control. States that she has not been pregnant in 5 years and just thought she couldn't get pregnant again. She rates her pain as 5/10 when it comes and the pain is located in the RLQ. It last for a few minutes and then goes away. She describes the bleeding as light and irregular since her period 5/12.    Past Medical History:  Diagnosis Date  . Asthma    childhood   . Chlamydia   Procedures: TAB due to fetal anomaly @ approximately [redacted] weeks gestation  No current facility-administered medications for this encounter.    Immunization History  Administered Date(s) Administered  . Influenza,inj,Quad PF,6+ Mos 03/10/2014  . Tdap 02/24/2014    Social History   Tobacco Use  . Smoking status: Current Some Day Smoker    Packs/day: 0.30    Types: Cigarettes    Last attempt to quit: 12/23/2013    Years since quitting: 6.0  . Smokeless tobacco: Never Used  Substance Use Topics  . Alcohol use: Yes    Comment: Occ  . Drug use: Yes    Types: Marijuana    Comment: occ    History reviewed. No pertinent family history.   Pertinent items are noted in HPI.  Vital signs: BP 117/78 (BP Location: Right Arm)   Pulse 80   Temp  98.6 F (37 C) (Oral)   Resp 18   Ht 5\' 7"  (1.702 m)   Wt 93.9 kg   LMP 11/22/2019   SpO2 100%   BMI 32.42 kg/m   BP 117/78 (BP Location: Right Arm)   Pulse 80   Temp 98.6 F (37 C) (Oral)   Resp 18   Ht 5\' 7"  (1.702 m)   Wt 93.9 kg   LMP 11/22/2019   SpO2 100%   BMI 32.42 kg/m   General Appearance:    Alert, cooperative, no distress, appears stated age  Head:    Normocephalic, without obvious abnormality, atraumatic        Nose:  mucosa normal, no drainage      Neck:   Supple, FROM  Back:      ROM normal, no CVA tenderness  Lungs:     Clear to auscultation bilaterally, respirations unlabored  Chest Wall:    No tenderness or deformity   Heart:    Regular rate and rhythm     Abdomen:     Soft, non-tender, bowel sounds active all four quadrants,    no masses, no organomegaly  Genitalia:    Normal external genitalia, small amount of brown watery d/c vaginal vault. Cervix closed, no CMT, no adnexal tenderness, uterus not enlarged.  Extremities:   Extremities normal, atraumatic, no cyanosis or edema     Skin:   Skin color, texture, turgor normal, no rashes or lesions  Lymph nodes:   Cervical, supraclavicular, and axillary nodes normal  Neurologic:  grossly intact    Results for orders placed or performed during the hospital encounter of 12/22/19 (from the past 24 hour(s))  CBC     Status: Abnormal   Collection Time: 12/22/19  2:28 PM  Result Value Ref Range   WBC 7.5 4.0 - 10.5 K/uL   RBC 5.33 (H) 3.87 - 5.11 MIL/uL   Hemoglobin 13.7 12.0 - 15.0 g/dL   HCT 12.8 36 - 46 %   MCV 79.9 (L) 80.0 - 100.0 fL   MCH 25.7 (L) 26.0 - 34.0 pg   MCHC 32.2 30.0 - 36.0 g/dL   RDW 78.6 76.7 - 20.9 %   Platelets 225 150 - 400 K/uL   nRBC 0.0 0.0 - 0.2 %  hCG, quantitative, pregnancy     Status: Abnormal   Collection Time: 12/22/19  2:28 PM  Result Value Ref Range   hCG, Beta Chain, Quant, S 30 (H) <5 mIU/mL   US OB LESS THAN 14 WEEKS WITH OB TRANSVAGINAL  Result Date:  12/22/2019 CLINICAL DATA:  Vaginal bleeding, pelvic pain, quantitative beta HCG 30 EXAM: OBSTETRIC <14 WK [place] AND TRANSVAGINAL OB US TECHNIQUE: Both transabdominal and transvaginal ultrasound examinations were performed for complete evaluation of the gestation as well as the maternal uterus, adnexal regions, and pelvic cul-de-sac. Transvaginal technique was performed to assess early pregnancy. COMPARISON:  None. FINDINGS: Intrauterine gestational sac: None Yolk sac:  Not Visualized. Embryo:  Not Visualized. Maternal uterus/adnexae: There is echogenic material within the cervix measuring 1.1 x 1.9 x 2.4 cm. It is unclear whether this reflects cervical polyp or possibly products of conception. There is no evidence of intrauterine pregnancy on this exam. The endometrium measures 5 mm in thickness. No other uterine abnormalities. Right ovary measures 4.2 x 2.5 x 3.4 cm, with multiple follicles. The left ovary measures 3.8 x 3.9 by 4.1 cm. Probable hemorrhagic cyst within the left ovary measures up to 3.7 cm. No pelvic mass.  Trace pelvic free fluid is likely physiologic. IMPRESSION: 1. No evidence of intrauterine pregnancy at this time. Serial beta HCG measurements and follow-up ultrasound may be useful to document intrauterine pregnancy. 2. Echogenic material within the cervix, which may reflect cervical polyp or blood products. 3. Probable hemorrhagic cyst left ovary. Otherwise normal appearance of the adnexal structures. Electronically Signed   By: Sharlet Salina M.D.   On: 12/22/2019 16:58     Problem List as of 12/22/2019 Reviewed: 12/22/2019  2:18 PM by Janne Napoleon, NP   Chlamydia infection   History of fetal anomaly in prior pregnancy, currently pregnant    Assessment/Plan: 29 y.o. female with vaginal bleeding during first trimester. Stable for d/c with normal H/H and no mass noted on ultrasound.  Diff Dx: SAB vs bleeding in early pregnancy vs ectopic.  Will have patient follow up in 2 days for repeat  Bhcg. Ectopic precautions given.

## 2019-12-22 NOTE — ED Triage Notes (Signed)
Patient presents to [facility] for intermittent vaginal bleeding x 3 months, and states sometimes she has a malodor.  Patient is not currently on birth control.  C/o pain at the beginning of sex.  States occasional lower abdominal pain.

## 2019-12-23 LAB — HIV ANTIBODY (ROUTINE TESTING W REFLEX): HIV Screen 4th Generation wRfx: NONREACTIVE

## 2019-12-23 LAB — CERVICOVAGINAL ANCILLARY ONLY
Chlamydia: POSITIVE — AB
Comment: NEGATIVE
Comment: NEGATIVE
Comment: NORMAL
Neisseria Gonorrhea: POSITIVE — AB
Trichomonas: NEGATIVE

## 2019-12-23 LAB — RPR: RPR Ser Ql: NONREACTIVE

## 2019-12-24 ENCOUNTER — Inpatient Hospital Stay (HOSPITAL_COMMUNITY)
Admission: AD | Admit: 2019-12-24 | Discharge: 2019-12-24 | Disposition: A | Discharge status: Home / Self Care | Payer: Medicaid Other | Attending: Obstetrics and Gynecology | Admitting: Obstetrics and Gynecology

## 2019-12-24 DIAGNOSIS — O98819 Other maternal infectious and parasitic diseases complicating pregnancy, unspecified trimester: Secondary | ICD-10-CM

## 2019-12-24 DIAGNOSIS — O98211 Gonorrhea complicating pregnancy, first trimester: Secondary | ICD-10-CM | POA: Insufficient documentation

## 2019-12-24 DIAGNOSIS — A568 Sexually transmitted chlamydial infection of other sites: Secondary | ICD-10-CM | POA: Diagnosis not present

## 2019-12-24 DIAGNOSIS — A549 Gonococcal infection, unspecified: Secondary | ICD-10-CM

## 2019-12-24 DIAGNOSIS — O98311 Other infections with a predominantly sexual mode of transmission complicating pregnancy, first trimester: Secondary | ICD-10-CM | POA: Diagnosis not present

## 2019-12-24 DIAGNOSIS — O039 Complete or unspecified spontaneous abortion without complication: Secondary | ICD-10-CM | POA: Diagnosis not present

## 2019-12-24 LAB — HCG, QUANTITATIVE, PREGNANCY: hCG, Beta Chain, Quant, S: 10 m[IU]/mL — ABNORMAL HIGH (ref <5)

## 2019-12-24 MED ORDER — AZITHROMYCIN 250 MG PO TABS
1000.0000 mg | ORAL_TABLET | Freq: Once | ORAL | Status: AC
  Administered 2019-12-24: 1000 mg via ORAL
  Filled 2019-12-24: qty 4

## 2019-12-24 MED ORDER — CEFTRIAXONE SODIUM 500 MG IJ SOLR
500.0000 mg | Freq: Once | INTRAMUSCULAR | Status: AC
  Administered 2019-12-24: 500 mg via INTRAMUSCULAR
  Filled 2019-12-24: qty 500

## 2019-12-24 NOTE — Telephone Encounter (Signed)
Attempted to return patients call. Patient seen in MAU on 7/13 for stated concerns. LM for patient to call the office for further questions or concerns. Call back number given.

## 2019-12-24 NOTE — MAU Provider Note (Signed)
[name]  is a 29 y.o. [id number]  at [redacted]w[redacted]d who presents to MAU today for follow-up quant hCG. She reports continued abdominal cramping & vaginal bleeding. Symptoms similar to the other day. Not saturating pads.  Pt reports she saw in mychart that she is positive for gonorrhea & chlamydia; has not been treated yet.   BP 127/80   Pulse 94   Temp 99.2 F (37.3 C)   Resp 18   Ht 5\' 7"  (1.702 m)   Wt 94.3 kg   LMP 11/22/2019   BMI 32.58 kg/m   GENERAL: Well-developed, well-nourished female in no acute distress.  HEENT: Normocephalic, atraumatic.   LUNGS: Effort normal HEART: Regular rate  SKIN: Warm, dry and without erythema PSYCH: Normal mood and affect    A: 1. Miscarriage  -hcg down to 10 from 30. RH positive. Pt already has scheduled f/u in the office  2. Chlamydia infection during pregnancy  -azithromycin 1 gm given in MAU  3. Gonorrhea in female  -rocephin 500 mg IM given in MAU    P: Discharge home Pelvic rest Partner should go to [facility] from treatment of gonorrhea & chlamydia Pt to keep scheduled appointment in the office.   [facility], NP  12/24/2019 7:04 PM

## 2019-12-24 NOTE — Discharge Instructions (Signed)
Gonorrhea Gonorrhea is a sexually transmitted disease (STD) that can affect both men and women. If left untreated, this infection can:  Damage the female or female organs.  Cause women and men to be unable to have children (be sterile).  Harm a fetus if an infected woman is pregnant. It is important to get treatment for gonorrhea as soon as possible. It is also necessary for all of your sexual partners to be tested for the infection. What are the causes? This condition is caused by bacteria called Neisseria gonorrhoeae. The infection is spread from person to person through sexual contact, including oral, anal, and vaginal sex. A newborn can contract the infection from his or her mother during birth. What increases the risk? The following factors may make you more likely to develop this condition:  Being a woman who is younger than 30 years of age and who is sexually active.  Being a woman 15 years of age or older who has: ? A new sex partner. ? More than one sex partner. ? A sex partner who has an STD.  Being a man who has: ? A new sex partner. ? More than one sex partner. ? A sex partner who has an STD.  Using condoms inconsistently.  Currently having, or having previously had, an STD.  Exchanging sex for money or drugs. What are the signs or symptoms? Some people do not have any symptoms. If you do have symptoms, they may be different for females and males. For females  Pain in the lower abdomen.  Abnormal vaginal discharge. The discharge may be cloudy, thick, or yellow-green in color.  Bleeding between periods.  Painful sex.  Burning or itching in and around the vagina.  Pain or burning when urinating.  Irritation, pain, bleeding, or discharge from the rectum. This may occur if the infection was spread by anal sex.  Sore throat or swollen lymph nodes in the neck. This may occur if the infection was spread by oral sex. For males  Abnormal discharge from the  penis. This discharge may be cloudy, thick, or yellow-green in color.  Pain or burning during urination.  Pain or swelling in the testicles.  Irritation, pain, bleeding, or discharge from the rectum. This may occur if the infection was spread by anal sex.  Sore throat, fever, or swollen lymph nodes in the neck. This may occur if the infection was spread by oral sex. How is this diagnosed? This condition is diagnosed based on:  A physical exam.  A sample of discharge that is examined under a microscope to look for the bacteria. The discharge may be taken from the urethra, cervix, throat, or rectum.  Urine tests. Not all of test results will be available during your visit. How is this treated? This condition is treated with antibiotic medicines. It is important for treatment to begin as soon as possible. Early treatment may prevent some problems from developing. Do not have sex during treatment. Avoid all types of sexual activity for 7 days after treatment is complete and until any sex partners have been treated. Follow these instructions at home:  Take over-the-counter and prescription medicines only as told by your health care provider.  Take your antibiotic medicine as told by your health care provider. Do not stop taking the antibiotic even if you start to feel better.  Do not have sex until at least 7 days after you and your partner(s) have finished treatment and your health care provider says it is  okay.  It is your responsibility to get your test results. Ask your health care provider, or the department performing the test, when your results will be ready.  If you test positive for gonorrhea, inform your recent sexual partners. This includes any oral, anal, or vaginal sex partners. They need to be checked for gonorrhea even if they do not have symptoms. They may need treatment, even if they test negative for gonorrhea.  Keep all follow-up visits as told by your health care  provider. This is important. How is this prevented?   Use latex condoms correctly every time you have sexual intercourse.  Ask if your sexual partner has been tested for STDs and had negative results.  Avoid having multiple sexual partners. Contact a health care provider if:  You develop a bad reaction to the medicine you were prescribed. This may include: ? A rash. ? Nausea. ? Vomiting. ? Diarrhea.  Your symptoms do not get better after a few days of taking antibiotics.  Your symptoms get worse.  You develop new symptoms.  Your pain gets worse.  You have a fever.  You develop pain, itching, or discharge around the eyes. Get help right away if:  You feel dizzy or faint.  You have trouble breathing or have shortness of breath.  You develop an irregular heartbeat.  You have severe abdominal pain with or without shoulder pain.  You develop any bumps or sores (lesions) on your skin.  You develop warmth, redness, pain, or swelling around your joints, such as the knee. Summary  Gonorrhea is an STD that can affect both men and women.  This condition is caused by bacteria called Neisseria gonorrhoeae. The infection is spread from person to person, usually through sexual contact, including oral, anal, and vaginal sex.  Symptoms vary between males and females. Generally, they include abnormal discharge and burning during urination. Women may also experience painful sex, itching around the vagina, and bleeding between menstrual periods. Men may also experience swelling of the testicles.  This condition is treated with antibiotic medicines. Do not have sex until at least 7 days after completing antibiotic treatment.  If left untreated, gonorrhea can have serious side effects and complications. This information is not intended to replace advice given to you by your health care provider. Make sure you discuss any questions you have with your health care provider. Document  Revised: 10/21/2018 Document Reviewed: 04/27/2016 Elsevier Patient Education  2020 [employer]   Chlamydia, Female Chlamydia is an STD (sexually transmitted disease). It is a bacterial infection that spreads (is contagious) through sexual contact. Chlamydia can occur in different areas of the body, including:  The tube that moves urine from the bladder out of the body (urethra).  The lower part of the uterus (cervix).  The throat.  The rectum. This condition is not difficult to treat. However, if left untreated, chlamydia can lead to more serious health problems, including pelvic inflammatory disorder (PID). PID can increase your risk of not being able to have children (sterility). Also, if chlamydia is left untreated and you are pregnant or become pregnant, there is a chance that your baby can become infected during delivery. This may cause serious health problems for the baby. What are the causes? Chlamydia is caused by the bacteria Chlamydia trachomatis. It is passed from an infected partner during sexual activity. Chlamydia can spread through contact with the genitals, mouth, or rectum. What are the signs or symptoms? In some cases, there may not be any  symptoms for this condition (asymptomatic), especially early in the infection. If symptoms develop, they may include:  Burning with urination.  Frequent urination.  Vaginal discharge.  Redness, soreness, and swelling (inflammation) of the rectum.  Bleeding or discharge from the rectum.  Abdominal pain.  Pain during sexual intercourse.  Bleeding between menstrual periods.  Itching, burning, or redness in the eyes, or discharge from the eyes. How is this diagnosed? This condition may be diagnosed with:  Urine tests.  Swab tests. Depending on your symptoms, your health care provider may use a cotton swab to collect discharge from your vagina or rectum to test for the bacteria.  A pelvic exam. How is this treated? This  condition is treated with antibiotic medicines. If you are pregnant, certain types of antibiotics will need to be avoided. Follow these instructions at home: Medicines  Take over-the-counter and prescription medicines only as told by your health care provider.  Take your antibiotic medicine as told by your health care provider. Do not stop taking the antibiotic even if you start to feel better. Sexual activity  Tell sexual partners about your infection. This includes any oral, anal, or vaginal sex partners you have had within 60 days of when your symptoms started. Sexual partners should also be treated, even if they have no signs of the disease.  Do not have sex until you and your sexual partners have completed treatment and your health care provider says it is okay. If your health care provider prescribed you a single dose treatment, wait 7 days after taking the treatment before having sex. General instructions  It is your responsibility to get your test results. Ask your health care provider, or the department performing the test, when your results will be ready.  Get plenty of rest.  Eat a healthy, well-balanced diet.  Drink enough fluids to keep your urine clear or pale yellow.  Keep all follow-up visits as told by your health care provider. This is important. You may need to be tested for infection again 3 months after treatment. How is this prevented? The only sure way to prevent chlamydia is to avoid having sex. However, you can lower your risk by:  Using latex condoms correctly every time you have sex.  Not having multiple sexual partners.  Asking if your sexual partner has been tested for STIs and had negative results. Contact a health care provider if:  You develop new symptoms or your symptoms do not get better after completing treatment.  You have a fever or chills.  You have pain during sexual intercourse. Get help right away if:  Your pain gets worse and does not  get better with medicine.  You develop flu-like symptoms, such as night sweats, sore throat, or muscle aches.  You experience nausea or vomiting.  You have difficulty swallowing.  You have bleeding between periods or after sex.  You have irregular menstrual periods.  You have abdominal or lower back pain that does not get better with medicine.  You feel weak or dizzy, or you faint.  You are pregnant and you develop symptoms of chlamydia. Summary  Chlamydia is an STD (sexually transmitted disease). It is a bacterial infection that spreads (is contagious) through sexual contact.  This condition is not difficult to treat, however. If left untreated, chlamydia can lead to more serious health problems, including pelvic inflammatory disease (PID).  In some cases, there may not be any symptoms for this condition (asymptomatic).  This condition is treated with antibiotic  medicines.  Using latex condoms correctly every time you have sex can help prevent chlamydia. This information is not intended to replace advice given to you by your health care provider. Make sure you discuss any questions you have with your health care provider. Document Revised: 11/19/2017 Document Reviewed: 05/14/2016 Elsevier Patient Education  2020 [employer] Miscarriage A miscarriage is the loss of an unborn baby (fetus) before the 20th week of pregnancy. Most miscarriages happen during the first 3 months of pregnancy. Sometimes, a miscarriage can happen before a woman knows that she is pregnant. Having a miscarriage can be an emotional experience. If you have had a miscarriage, talk with your health care provider about any questions you may have about miscarrying, the grieving process, and your plans for future pregnancy. What are the causes? A miscarriage may be caused by:  Problems with the genes or chromosomes of the fetus. These problems make it impossible for the baby to develop normally. They are often  the result of random errors that occur early in the development of the baby, and are not passed from parent to child (not inherited).  Infection of the cervix or uterus.  Conditions that affect hormone balance in the body.  Problems with the cervix, such as the cervix opening and thinning before pregnancy is at term (cervical insufficiency).  Problems with the uterus. These may include: ? A uterus with an abnormal shape. ? Fibroids in the uterus. ? Congenital abnormalities. These are problems that were present at birth.  Certain medical conditions.  Smoking, drinking alcohol, or using drugs.  Injury (trauma). In many cases, the cause of a miscarriage is not known. What are the signs or symptoms? Symptoms of this condition include:  Vaginal bleeding or spotting, with or without cramps or pain.  Pain or cramping in the abdomen or lower back.  Passing fluid, tissue, or blood clots from the vagina. How is this diagnosed? This condition may be diagnosed based on:  A physical exam.  Ultrasound.  Blood tests.  Urine tests. How is this treated? Treatment for a miscarriage is sometimes not necessary if you naturally pass all the tissue that was in your uterus. If necessary, this condition may be treated with:  Dilation and curettage (D&C). This is a procedure in which the cervix is stretched open and the lining of the uterus (endometrium) is scraped. This is done only if tissue from the fetus or placenta remains in the body (incomplete miscarriage).  Medicines, such as: ? Antibiotic medicine, to treat infection. ? Medicine to help the body pass any remaining tissue. ? Medicine to reduce (contract) the size of the uterus. These medicines may be given if you have a lot of bleeding. If you have Rh negative blood and your baby was Rh positive, you will need a shot of a medicine called Rh immunoglobulinto protect your future babies from Rh blood problems. "Rh-negative" and  "Rh-positive" refer to whether or not the blood has a specific protein found on the surface of red blood cells (Rh factor). Follow these instructions at home: Medicines   Take over-the-counter and prescription medicines only as told by your health care provider.  If you were prescribed antibiotic medicine, take it as told by your health care provider. Do not stop taking the antibiotic even if you start to feel better.  Do not take NSAIDs, such as aspirin and ibuprofen, unless they are approved by your health care provider. These medicines can cause bleeding. Activity  Rest as directed.  Ask your health care provider what activities are safe for you.  Have someone help with home and family responsibilities during this time. General instructions  Keep track of the number of sanitary pads you use each day and how soaked (saturated) they are. Write down this information.  Monitor the amount of tissue or blood clots that you pass from your vagina. Save any large amounts of tissue for your health care provider to examine.  Do not use tampons, douche, or have sex until your health care provider approves.  To help you and your partner with the process of grieving, talk with your health care provider or seek counseling.  When you are ready, meet with your health care provider to discuss any important steps you should take for your health. Also, discuss steps you should take to have a healthy pregnancy in the future.  Keep all follow-up visits as told by your health care provider. This is important. Where to find more information  The [employer] of Ob[employer]etricians [employer] Gynec[employer]: www.acog.org  [place] Department of Health and [employer] of Women's Health: http://hoffman.com/ Contact a health care provider if:  You have a fever or chills.  You have a foul smelling vaginal discharge.  You have more bleeding instead of less. Get help right away if:  You have severe cramps  or pain in your back or abdomen.  You pass blood clots or tissue from your vagina that is walnut-sized or larger.  You soak more than 1 regular sanitary pad in an hour.  You become light-headed or weak.  You pass out.  You have feelings of sadness that take over your thoughts, or you have thoughts of hurting yourself. Summary  Most miscarriages happen in the first 3 months of pregnancy. Sometimes miscarriage happens before a woman even knows that she is pregnant.  Follow your health care provider's instruction for home care. Keep all follow-up appointments.  To help you and your partner with the process of grieving, talk with your health care provider or seek counseling. This information is not intended to replace advice given to you by your health care provider. Make sure you discuss any questions you have with your health care provider. Document Revised: 09/19/2018 Document Reviewed: 07/03/2016 Elsevier Patient Education  2020 [employer].

## 2019-12-24 NOTE — MAU Note (Signed)
Here for follow up HCG level and STD treatment. Reports some small amount of vag bleeding and some cramping.

## 2019-12-25 ENCOUNTER — Ambulatory Visit: Payer: Medicaid Other

## 2020-01-05 ENCOUNTER — Other Ambulatory Visit (HOSPITAL_COMMUNITY)
Admission: RE | Admit: 2020-01-05 | Discharge: 2020-01-05 | Disposition: A | Discharge status: Home / Self Care | Payer: Medicaid Other | Source: Ambulatory Visit | Attending: Student | Admitting: Student

## 2020-01-05 ENCOUNTER — Ambulatory Visit (INDEPENDENT_AMBULATORY_CARE_PROVIDER_SITE_OTHER): Payer: Medicaid Other | Admitting: Certified Nurse Midwife

## 2020-01-05 ENCOUNTER — Encounter: Payer: Self-pay | Admitting: Certified Nurse Midwife

## 2020-01-05 ENCOUNTER — Other Ambulatory Visit: Payer: Self-pay

## 2020-01-05 VITALS — BP 130/77 | HR 73 | Ht 66.0 in | Wt 209.0 lb

## 2020-01-05 DIAGNOSIS — Z01419 Encounter for gynecological examination (general) (routine) without abnormal findings: Secondary | ICD-10-CM

## 2020-01-05 DIAGNOSIS — G4700 Insomnia, unspecified: Secondary | ICD-10-CM

## 2020-01-05 DIAGNOSIS — F17210 Nicotine dependence, cigarettes, uncomplicated: Secondary | ICD-10-CM | POA: Diagnosis not present

## 2020-01-05 DIAGNOSIS — F419 Anxiety disorder, unspecified: Secondary | ICD-10-CM

## 2020-01-05 MED ORDER — MAG-OXIDE 200 MG PO TABS: 400.0000 mg | ORAL_TABLET | Freq: Every day | ORAL | 3 refills | Status: DC

## 2020-01-05 NOTE — Patient Instructions (Signed)
Health Maintenance, Female Adopting a healthy lifestyle and getting preventive care are important in promoting health and wellness. Ask your health care provider about:  The right schedule for you to have regular tests and exams.  Things you can do on your own to prevent diseases and keep yourself healthy. What should I know about diet, weight, and exercise? Eat a healthy diet   Eat a diet that includes plenty of vegetables, fruits, low-fat dairy products, and lean protein.  Do not eat a lot of foods that are high in solid fats, added sugars, or sodium. Maintain a healthy weight Body mass index (BMI) is used to identify weight problems. It estimates body fat based on height and weight. Your health care provider can help determine your BMI and help you achieve or maintain a healthy weight. Get regular exercise Get regular exercise. This is one of the most important things you can do for your health. Most adults should:  Exercise for at least 150 minutes each week. The exercise should increase your heart rate and make you sweat (moderate-intensity exercise).  Do strengthening exercises at least twice a week. This is in addition to the moderate-intensity exercise.  Spend less time sitting. Even light physical activity can be beneficial. Watch cholesterol and blood lipids Have your blood tested for lipids and cholesterol at 30 years of age, then have this test every 5 years. Have your cholesterol levels checked more often if:  Your lipid or cholesterol levels are high.  You are older than 30 years of age.  You are at high risk for heart disease. What should I know about cancer screening? Depending on your health history and family history, you may need to have cancer screening at various ages. This may include screening for:  Breast cancer.  Cervical cancer.  Colorectal cancer.  Skin cancer.  Lung cancer. What should I know about heart disease, diabetes, and high blood  pressure? Blood pressure and heart disease  High blood pressure causes heart disease and increases the risk of stroke. This is more likely to develop in people who have high blood pressure readings, are of African descent, or are overweight.  Have your blood pressure checked: ? Every 3-5 years if you are 18-39 years of age. ? Every year if you are 40 years old or older. Diabetes Have regular diabetes screenings. This checks your fasting blood sugar level. Have the screening done:  Once every three years after age 40 if you are at a normal weight and have a low risk for diabetes.  More often and at a younger age if you are overweight or have a high risk for diabetes. What should I know about preventing infection? Hepatitis B If you have a higher risk for hepatitis B, you should be screened for this virus. Talk with your health care provider to find out if you are at risk for hepatitis B infection. Hepatitis C Testing is recommended for:  Everyone born from 1945 through 1965.  Anyone with known risk factors for hepatitis C. Sexually transmitted infections (STIs)  Get screened for STIs, including gonorrhea and chlamydia, if: ? You are sexually active and are younger than 30 years of age. ? You are older than 30 years of age and your health care provider tells you that you are at risk for this type of infection. ? Your sexual activity has changed since you were last screened, and you are at increased risk for chlamydia or gonorrhea. Ask your health care provider if   you are at risk.  Ask your health care provider about whether you are at high risk for HIV. Your health care provider may recommend a prescription medicine to help prevent HIV infection. If you choose to take medicine to prevent HIV, you should first get tested for HIV. You should then be tested every 3 months for as long as you are taking the medicine. Pregnancy  If you are about to stop having your period (premenopausal) and  you may become pregnant, seek counseling before you get pregnant.  Take 400 to 800 micrograms (mcg) of folic acid every day if you become pregnant.  Ask for birth control (contraception) if you want to prevent pregnancy. Osteoporosis and menopause Osteoporosis is a disease in which the bones lose minerals and strength with aging. This can result in bone fractures. If you are 78 years old or older, or if you are at risk for osteoporosis and fractures, ask your health care provider if you should:  Be screened for bone loss.  Take a calcium or vitamin D supplement to lower your risk of fractures.  Be given hormone replacement therapy (HRT) to treat symptoms of menopause. Follow these instructions at home: Lifestyle  Do not use any products that contain nicotine or tobacco, such as cigarettes, e-cigarettes, and chewing tobacco. If you need help quitting, ask your health care provider.  Do not use street drugs.  Do not share needles.  Ask your health care provider for help if you need support or information about quitting drugs. Alcohol use  Do not drink alcohol if: ? Your health care provider tells you not to drink. ? You are pregnant, may be pregnant, or are planning to become pregnant.  If you drink alcohol: ? Limit how much you use to 0-1 drink a day. ? Limit intake if you are breastfeeding.  Be aware of how much alcohol is in your drink. In the [place] one drink equals one 12 oz bottle of beer (355 mL), one 5 oz glass of wine (148 mL), or one 1 oz glass of hard liquor (44 mL). General instructions  Schedule regular health, dental, and eye exams.  Stay current with your vaccines.  Tell your health care provider if: ? You often feel depressed. ? You have ever been abused or do not feel safe at home. Summary  Adopting a healthy lifestyle and getting preventive care are important in promoting health and wellness.  Follow your health care provider's instructions about healthy  diet, exercising, and getting tested or screened for diseases.  Follow your health care provider's instructions on monitoring your cholesterol and blood pressure. This information is not intended to replace advice given to you by your health care provider. Make sure you discuss any questions you have with your health care provider. Document Revised: 05/21/2018 Document Reviewed: 05/21/2018 Elsevier Patient Education  2020 [employer].   Safe Sex Practicing safe sex means taking steps before and during sex to reduce your risk of:  Getting an STI (sexually transmitted infection).  Giving your partner an STI.  Unwanted or unplanned pregnancy. How can I practice safe sex?     Ways you can practice safe sex  Limit your sexual partners to only one partner who is having sex with only you.  Avoid using alcohol and drugs before having sex. Alcohol and drugs can affect your judgment.  Before having sex with a new partner: ? Talk to your partner about past partners, past STIs, and drug use. ? Get screened  for STIs and discuss the results with your partner. Ask your partner to get screened, too.  Check your body regularly for sores, blisters, rashes, or unusual discharge. If you notice any of these problems, visit your health care provider.  Avoid sexual contact if you have symptoms of an infection or you are being treated for an STI.  While having sex, use a condom. Make sure to: ? Use a condom every time you have vaginal, oral, or anal sex. Both females and males should wear condoms during oral sex. ? Keep condoms in place from the beginning to the end of sexual activity. ? Use a latex condom, if possible. Latex condoms offer the best protection. ? Use only water-based lubricants with a condom. Using petroleum-based lubricants or oils will weaken the condom and increase the chance that it will break. Ways your health care provider can help you practice safe sex  See your health care  provider for regular screenings, exams, and tests for STIs.  Talk with your health care provider about what kind of birth control (contraception) is best for you.  Get vaccinated against hepatitis B and human papillomavirus (HPV).  If you are at risk of being infected with HIV (human immunodeficiency virus), talk with your health care provider about taking a prescription medicine to prevent HIV infection. You are at risk for HIV if you: ? Are a man who has sex with other men. ? Are sexually active with more than one partner. ? Take drugs by injection. ? Have a sex partner who has HIV. ? Have unprotected sex. ? Have sex with someone who has sex with both men and women. ? Have had an STI. Follow these instructions at home:  Take over-the-counter and prescription medicines as told by your health care provider.  Keep all follow-up visits as told by your health care provider. This is important. Where to find more information  Centers for Disease Control and Prevention: https://www.cdc.gov/std/prevention/default.htm  Planned Parenthood: https://www.plannedparenthood.org/  Office on Women's Health: https://www.womenshealth.gov/a-z-topics/sexually-transmitted-infections Summary  Practicing safe sex means taking steps before and during sex to reduce your risk of STIs, giving your partner STIs, and having an unwanted or unplanned pregnancy.  Before having sex with a new partner, talk to your partner about past partners, past STIs, and drug use.  Use a condom every time you have vaginal, oral, or anal sex. Both females and males should wear condoms during oral sex.  Check your body regularly for sores, blisters, rashes, or unusual discharge. If you notice any of these problems, visit your health care provider.  See your health care provider for regular screenings, exams, and tests for STIs. This information is not intended to replace advice given to you by your health care provider. Make  sure you discuss any questions you have with your health care provider. Document Revised: 09/19/2018 Document Reviewed: 03/10/2018 Elsevier Patient Education  2020 Elsevier Inc.  

## 2020-01-05 NOTE — Progress Notes (Signed)
GYNECOLOGY CLINIC ANNUAL PREVENTATIVE CARE ENCOUNTER NOTE  Subjective:   Gail Robinson is a 29 y.o. [phone] female here for a routine annual gynecologic exam.  Current complaints: Pt stated tearfully "I don't feel like myself at all." Reports feeling easily agitated, insomnia, and increased facial hair growth - all of which is very troubling to her. Denies abnormal vaginal bleeding, discharge, pelvic pain, problems with intercourse or other gynecologic concerns.    Gynecologic History Patient's last menstrual period was 11/22/2019. Contraception: none - interested but wants to wait until hormone levels come back to decide on type, does not like how hormonal BC has made her feel in the past but open to suggestions Last Pap: 2015. Results were: normal Last mammogram: N/A.   Obstetric History OB History  Gravida Para Term Preterm AB Living  4 2 2   1 2   SAB TAB Ectopic Multiple Live Births    1   0 2    # Outcome Date GA Lbr Len/2nd Weight Sex Delivery Anes PTL Lv  4 Gravida           3 Term 05/10/14 [redacted]w[redacted]d 07:20 / 00:18 6 lb 8.9 oz (2.974 kg) F Vag-Spont EPI  LIV  2 TAB 2013 [redacted]w[redacted]d            Birth Comments: terminated pregnancy due to informed by providers that "baby intestines outside of body would be 50/50 chance of surival"  Provider encourged pt to have abortion  1 Term 01/03/10 [redacted]w[redacted]d  8 lb 7 oz (3.827 kg) M Vag-Spont EPI  LIV    Past Medical History:  Diagnosis Date  . Asthma    childhood    Past Surgical History:  Procedure Laterality Date  . NO PAST SURGERIES      Current Outpatient Medications on File Prior to Visit  Medication Sig Dispense Refill  . Biotin w/ Vitamins C & E (HAIR/SKIN/NAILS PO) Take by mouth.    . COLLAGEN PO Take by mouth.    . Multiple Vitamins-Minerals (WOMENS ONE DAILY PO) Take by mouth.    [redacted]w[redacted]d albuterol (PROVENTIL HFA;VENTOLIN HFA) 108 (90 BASE) MCG/ACT inhaler Inhale 1-2 puffs into the lungs every 6 (six) hours as needed for wheezing  or shortness of breath. (Patient not taking: Reported on 01/05/2020) 1 Inhaler 12  . albuterol (PROVENTIL) (2.5 MG/3ML) 0.083% nebulizer solution Take 3 mLs (2.5 mg total) by nebulization every 6 (six) hours as needed for wheezing. (Patient not taking: Reported on 01/05/2020) 75 mL 12  . [DISCONTINUED] beclomethasone (QVAR) 80 MCG/ACT inhaler Inhale 2 puffs into the lungs 2 (two) times daily. (Patient not taking: Reported on 04/09/2018) 1 Inhaler 12   No current facility-administered medications on file prior to visit.    No Known Allergies  Social History   Socioeconomic History  . Marital status: Single    Spouse name: Not on file  . Number of children: Not on file  . Years of education: Not on file  . Highest education level: Not on file  Occupational History  . Not on file  Tobacco Use  . Smoking status: Current Some Day Smoker    Packs/day: 0.30    Types: Cigarettes    Last attempt to quit: 12/23/2013    Years since quitting: 6.0  . Smokeless tobacco: Never Used  Substance and Sexual Activity  . Alcohol use: Yes    Comment: Occ  . Drug use: Yes    Types: Marijuana    Comment: occ  .  Sexual activity: Not Currently  Other Topics Concern  . Not on file  Social History Narrative  . Not on file   Social Determinants of Health   Financial Resource Strain:   . Difficulty of Paying Living Expenses:   Food Insecurity:   . Worried About Programme researcher, broadcasting/film/video in the Last Year:   . Barista in the Last Year:   Transportation Needs:   . Freight forwarder (Medical):   Marland Kitchen Lack of Transportation (Non-Medical):   Physical Activity:   . Days of Exercise per Week:   . Minutes of Exercise per Session:   Stress:   . Feeling of Stress :   Social Connections:   . Frequency of Communication with Friends and Family:   . Frequency of Social Gatherings with Friends and Family:   . Attends Religious Services:   . Active Member of Clubs or Organizations:   . Attends Tax inspector Meetings:   Marland Kitchen Marital Status:   Intimate Partner Violence:   . Fear of Current or Ex-Partner:   . Emotionally Abused:   Marland Kitchen Physically Abused:   . Sexually Abused:     No family history on file.  The following portions of the patient's history were reviewed and updated as appropriate: allergies, current medications, past family history, past medical history, past social history, past surgical history and problem list.  Review of Systems Pertinent items noted in HPI and remainder of comprehensive ROS otherwise negative.   Objective:  BP (!) 130/77   Pulse 73   Ht 5\' 6"  (1.676 m)   Wt (!) 209 lb (94.8 kg)   LMP 11/22/2019   Breastfeeding No   BMI 33.73 kg/m  CONSTITUTIONAL: Well-developed, well-nourished female in no acute distress.  HENT:  Normocephalic, atraumatic, External right and left ear normal. Oropharynx is clear and moist EYES: Conjunctivae and EOM are normal. Pupils are equal, round, and reactive to light. No scleral icterus.  NECK: Normal range of motion, supple, no masses.  Normal thyroid.  SKIN: Skin is warm and dry. No rash noted. Not diaphoretic. No erythema. No pallor. NEUROLGIC: Alert and oriented to person, place, and time. Normal reflexes, muscle tone coordination. No cranial nerve deficit noted. PSYCHIATRIC: Having feelings of anxiety and depression, finding it hard to sleep, feeling low but no suicidal ideation CARDIOVASCULAR: Normal heart rate noted, regular rhythm RESPIRATORY: Clear to auscultation bilaterally. Effort and breath sounds normal, no problems with respiration noted. BREASTS: Symmetric in size. No masses, skin changes, nipple drainage, or lymphadenopathy. ABDOMEN: Soft, normal bowel sounds, no distention noted.  No tenderness, rebound or guarding.  PELVIC: Normal appearing external genitalia; normal appearing vaginal mucosa and cervix.  No abnormal discharge noted.  Pap smear obtained.  Normal uterine size, no other palpable masses, no  uterine or adnexal tenderness. MUSCULOSKELETAL: Normal range of motion. No tenderness.  No cyanosis, clubbing, or edema.  2+ distal pulses.   Assessment:  Annual gynecologic examination with pap smear   Plan:  Will follow up results of pap smear and manage accordingly. Annual labs ordered and will be managed accordingly Ordered magnesium at bedtime for insomnia Referral to [facility] made Routine preventative health maintenance measures emphasized. Please refer to After Visit Summary for other counseling recommendations.    [facility], CNM

## 2020-01-06 LAB — CYTOLOGY - PAP
Chlamydia: NEGATIVE
Comment: NEGATIVE
Comment: NORMAL
Diagnosis: NEGATIVE
Neisseria Gonorrhea: NEGATIVE

## 2020-01-06 NOTE — BH Specialist Note (Signed)
Integrated Behavioral Health via Telemedicine Video (Caregility) Visit  01/06/2020 Gail Robinson [id number]  Number of Integrated Behavioral Health visits: 1 Session Start time: 8:15  Session End time: 9:17 Total time: 35  Referring Provider: Edd Arbour, CNM Type of Visit: Video Patient/Family location: Home [facility] Provider location: [facility]  All persons participating in visit: Patient Gail Robinson and Gail Robinson    Confirmed patient's address: Yes  Confirmed patient's phone number: Yes  Any changes to demographics: No   Confirmed patient's insurance: Yes  Any changes to patient's insurance: No   Discussed confidentiality: Yes   I connected with Gail Robinson  by a video enabled telemedicine application (Caregility) and verified that I am speaking with the correct person using two identifiers.     I discussed the limitations of evaluation and management by telemedicine and the availability of in person appointments.  I discussed that the purpose of this visit is to provide behavioral health care while limiting exposure to the novel coronavirus.   Discussed there is a possibility of technology failure and discussed alternative modes of communication if that failure occurs.  I discussed that engaging in this virtual visit, they consent to the provision of behavioral healthcare and the services will be billed under their insurance.  Patient and/or legal guardian expressed understanding and consented to virtual visit: Yes   PRESENTING CONCERNS: Patient and/or family reports the following symptoms/concerns: Pt states her primary concern today is an increase in feeling overwhelmed with emotions; worry affecting sleep quality and increasing irritability, all attributed to possible hormonal imbalance, recent miscarriage, and interpersonal conflict with mothers (both adoptive and birth). Pt using  journalling and music to cope; helps to begin talking through feelings today Duration of problem: Increase in past 6 months; Severity of problem: moderate  STRENGTHS (Protective Factors/Coping Skills): Identifies self-coping strategies; self-awareness; motivated for change  GOALS ADDRESSED: Patient will: 1.  Reduce symptoms of: anxiety, depression, mood instability and stress  2.  Increase knowledge and/or ability of: healthy habits and self-management skills  3.  Demonstrate ability to: Increase healthy adjustment to current life circumstances  INTERVENTIONS: Interventions utilized:  Brief CBT and Psychoeducation and/or Health Education Standardized Assessments completed: PHQ/GAD given within past two weeks  ASSESSMENT: Patient currently experiencing Grief and Mood disorder, unspecified.   Patient may benefit from psychoeducation and brief therapeutic interventions regarding coping with symptoms of anxiety, depression, and life stress .  PLAN: 1. Follow up with behavioral health clinician on : Two weeks 2. Behavioral recommendations:  -Continue using self-coping strategies daily that have helped to manage symptoms (music, journalling, time alone to recharge, etc.) -Consider adding Worry Time strategy to daily plan, as discussed -Be kind to self during this time; prioritize healthy sleep as self-care daily for at least two weeks.  -Consider apps for additional self-care, as needed (on After Visit Summary)  3. Referral(s): Integrated [employer] (In Clinic)  I discussed the assessment and treatment plan with the patient and/or parent/guardian. They were provided an opportunity to ask questions and all were answered. They agreed with the plan and demonstrated an understanding of the instructions.   They were advised to call back or seek an in-person evaluation if the symptoms worsen or if the condition fails to improve as anticipated.  Gail Robinson  Depression  screen [facility] 2/9 01/05/2020  Decreased Interest 1  Down, Depressed, Hopeless 2  PHQ - 2 Score 3  Altered sleeping  3  Tired, decreased energy 2  Change in appetite 1  Feeling bad or failure about yourself  0  Trouble concentrating 0  Moving slowly or fidgety/restless 0  Suicidal thoughts 0  PHQ-9 Score 9   GAD 7 : Generalized Anxiety Score 01/05/2020  Nervous, Anxious, on Edge 2  Control/stop worrying 1  Worry too much - different things 3  Trouble relaxing 1  Restless 1  Easily annoyed or irritable 3  Afraid - awful might happen 0  Total GAD 7 Score 11

## 2020-01-11 ENCOUNTER — Ambulatory Visit (INDEPENDENT_AMBULATORY_CARE_PROVIDER_SITE_OTHER): Payer: Medicaid Other | Admitting: Clinical

## 2020-01-11 DIAGNOSIS — F4321 Adjustment disorder with depressed mood: Secondary | ICD-10-CM

## 2020-01-11 DIAGNOSIS — F39 Unspecified mood [affective] disorder: Secondary | ICD-10-CM

## 2020-01-11 NOTE — Patient Instructions (Addendum)
[facility] [address] [place], [place] [address] [phone] (main office) [phone] (Goldie Tregoning's office)  /Emotional Wellbeing Apps and Websites Here are a few free apps meant to help you to help yourself.  To find, try searching on the internet to see if the app is offered on Apple/Android devices. If your first choice doesn't come up on your device, the good news is that there are many choices! Play around with different apps to see which ones are helpful to you.    Calm This is an app meant to help increase calm feelings. Includes info, strategies, and tools for tracking your feelings.      Calm Harm  This app is meant to help with self-harm. Provides many 5-minute or 15-min coping strategies for doing instead of hurting yourself.       Healthy Minds Health Minds is a problem-solving tool to help deal with emotions and cope with stress you encounter wherever you are.      MindShift This app can help people cope with anxiety. Rather than trying to avoid anxiety, you can make an important shift and face it.      MY3  MY3 features a support system, safety plan and resources with the goal of offering a tool to use in a time of need.       My Life My Voice  This mood journal offers a simple solution for tracking your thoughts, feelings and moods. Animated emoticons can help identify your mood.       Relax Melodies Designed to help with sleep, on this app you can mix sounds and meditations for relaxation.      Smiling Mind Smiling Mind is meditation made easy: it's a simple tool that helps put a smile on your mind.        Stop, Breathe & Think  A friendly, simple guide for people through meditations for mindfulness and compassion.  Stop, Breathe and Think Kids Enter your current feelings and choose a "mission" to help you cope. Offers videos for certain moods instead of just sound recordings.       Team  Orange The goal of this tool is to help teens change how they think, act, and react. This app helps you focus on your own good feelings and experiences.      The Virtual Hope Box The Virtual Hope Box (VHB) contains simple tools to help patients with coping, relaxation, distraction, and positive thinking.     

## 2020-01-11 NOTE — BH Specialist Note (Signed)
Pt did not arrive to video visit and did not answer the phone ; Left HIPPA-compliant message to call back Asher Muir from [facility] [employer] [facility] at [phone] (main office) or [phone] (Aahna Rossa's office).  ; left MyChart message for patient.    Integrated Behavioral Health via Telemedicine Video [employer]) Visit  01/11/2020 JAXIE RACANELLI [id number]  Rae Lips

## 2020-01-12 ENCOUNTER — Encounter: Payer: Self-pay | Admitting: General Practice

## 2020-01-15 ENCOUNTER — Emergency Department (HOSPITAL_COMMUNITY)
Admission: EM | Admit: 2020-01-15 | Discharge: 2020-01-15 | Disposition: A | Discharge status: Home / Self Care | Payer: Medicaid Other | Attending: Emergency Medicine | Admitting: Emergency Medicine

## 2020-01-15 ENCOUNTER — Emergency Department (HOSPITAL_COMMUNITY): Payer: Medicaid Other

## 2020-01-15 ENCOUNTER — Other Ambulatory Visit: Payer: Self-pay

## 2020-01-15 DIAGNOSIS — M542 Cervicalgia: Secondary | ICD-10-CM | POA: Insufficient documentation

## 2020-01-15 DIAGNOSIS — R42 Dizziness and giddiness: Secondary | ICD-10-CM | POA: Diagnosis not present

## 2020-01-15 DIAGNOSIS — Z5321 Procedure and treatment not carried out due to patient leaving prior to being seen by health care provider: Secondary | ICD-10-CM | POA: Diagnosis not present

## 2020-01-15 DIAGNOSIS — R531 Weakness: Secondary | ICD-10-CM | POA: Diagnosis not present

## 2020-01-15 DIAGNOSIS — H538 Other visual disturbances: Secondary | ICD-10-CM | POA: Diagnosis not present

## 2020-01-15 LAB — CBC WITH DIFFERENTIAL/PLATELET
Basophils Absolute: 0.1 10*3/uL (ref 0.0–0.2)
Basos: 1 %
EOS (ABSOLUTE): 0.7 10*3/uL — ABNORMAL HIGH (ref 0.0–0.4)
Eos: 11 %
Hematocrit: 42.9 % (ref 34.0–46.6)
Hemoglobin: 13.5 g/dL (ref 11.1–15.9)
Immature Grans (Abs): 0 10*3/uL (ref 0.0–0.1)
Immature Granulocytes: 0 %
Lymphocytes Absolute: 2.1 10*3/uL (ref 0.7–3.1)
Lymphs: 35 %
MCH: 25.4 pg — ABNORMAL LOW (ref 26.6–33.0)
MCHC: 31.5 g/dL (ref 31.5–35.7)
MCV: 81 fL (ref 79–97)
Monocytes Absolute: 0.5 10*3/uL (ref 0.1–0.9)
Monocytes: 9 %
Neutrophils Absolute: 2.7 10*3/uL (ref 1.4–7.0)
Neutrophils: 44 %
Platelets: 248 10*3/uL (ref 150–450)
RBC: 5.31 x10E6/uL — ABNORMAL HIGH (ref 3.77–5.28)
RDW: 13.4 % (ref 11.7–15.4)
WBC: 6 10*3/uL (ref 3.4–10.8)

## 2020-01-15 LAB — LIPID PANEL
Chol/HDL Ratio: 2.4 ratio (ref 0.0–4.4)
Cholesterol, Total: 173 mg/dL (ref 100–199)
HDL: 73 mg/dL (ref >39)
LDL Chol Calc (NIH): 88 mg/dL (ref 0–99)
Triglycerides: 63 mg/dL (ref 0–149)
VLDL Cholesterol Cal: 12 mg/dL (ref 5–40)

## 2020-01-15 LAB — VITAMIN D 1,25 DIHYDROXY
Vitamin D 1, 25 (OH)2 Total: 53 pg/mL
Vitamin D2 1, 25 (OH)2: <10 pg/mL
Vitamin D3 1, 25 (OH)2: 47 pg/mL

## 2020-01-15 LAB — I-STAT BETA HCG BLOOD, ED (MC, WL, AP ONLY): I-stat hCG, quantitative: <5 m[IU]/mL (ref <5)

## 2020-01-15 LAB — THYROID PANEL WITH TSH
Free Thyroxine Index: 1.9 (ref 1.2–4.9)
T3 Uptake Ratio: 29 % (ref 24–39)
T4, Total: 6.7 ug/dL (ref 4.5–12.0)
TSH: 0.649 u[IU]/mL (ref 0.450–4.500)

## 2020-01-15 LAB — HEPATITIS C ANTIBODY: Hep C Virus Ab: <0.1 s/co ratio (ref 0.0–0.9)

## 2020-01-15 LAB — HORMONE PANEL (T4,TSH,FSH,TESTT,SHBG,DHEA,ETC)
DHEA-Sulfate, LCMS: 177 ug/dL
Estradiol, Serum, MS: 105 pg/mL
Estrone Sulfate: 121 ng/dL
Follicle Stimulating Hormone: 4.1 m[IU]/mL
Free T-3: 4.1 pg/mL
Free Testosterone, Serum: 2.6 pg/mL
Progesterone, Serum: <10 ng/dL
Sex Hormone Binding Globulin: 67.7 nmol/L
T4: 7.6 ug/dL
TSH: 0.7 uU/mL
Testosterone, Serum (Total): 37 ng/dL
Testosterone-% Free: 0.7 %
Triiodothyronine (T-3), Serum: 134 ng/dL

## 2020-01-15 LAB — HEMOGLOBIN A1C
Est. average glucose Bld gHb Est-mCnc: 105 mg/dL
Hgb A1c MFr Bld: 5.3 % (ref 4.8–5.6)

## 2020-01-15 LAB — ETHANOL: Alcohol, Ethyl (B): 173 mg/dL — ABNORMAL HIGH (ref <10)

## 2020-01-15 LAB — HIV ANTIBODY (ROUTINE TESTING W REFLEX): HIV Screen 4th Generation wRfx: NONREACTIVE

## 2020-01-15 LAB — BETA HCG QUANT (REF LAB): hCG Quant: 1 m[IU]/mL
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
## 2020-01-15 NOTE — ED Notes (Signed)
Registration called for pt no answer

## 2020-01-15 NOTE — ED Notes (Signed)
Called for vitals 

## 2020-01-15 NOTE — ED Notes (Signed)
I called to repeat pt vitals no answer and no answer outside

## 2020-01-16 ENCOUNTER — Emergency Department (HOSPITAL_COMMUNITY)
Admission: EM | Admit: 2020-01-16 | Discharge: 2020-01-16 | Disposition: A | Discharge status: Home / Self Care | Payer: Medicaid Other | Attending: Emergency Medicine | Admitting: Emergency Medicine

## 2020-01-16 ENCOUNTER — Encounter (HOSPITAL_COMMUNITY): Payer: Self-pay | Admitting: Emergency Medicine

## 2020-01-16 ENCOUNTER — Emergency Department (HOSPITAL_COMMUNITY): Payer: Medicaid Other

## 2020-01-16 DIAGNOSIS — H532 Diplopia: Secondary | ICD-10-CM | POA: Diagnosis not present

## 2020-01-16 DIAGNOSIS — H1132 Conjunctival hemorrhage, left eye: Secondary | ICD-10-CM | POA: Insufficient documentation

## 2020-01-16 DIAGNOSIS — S0990XA Unspecified injury of head, initial encounter: Secondary | ICD-10-CM | POA: Diagnosis present

## 2020-01-16 DIAGNOSIS — Y9389 Activity, other specified: Secondary | ICD-10-CM | POA: Diagnosis not present

## 2020-01-16 DIAGNOSIS — Y999 Unspecified external cause status: Secondary | ICD-10-CM | POA: Insufficient documentation

## 2020-01-16 DIAGNOSIS — M549 Dorsalgia, unspecified: Secondary | ICD-10-CM | POA: Insufficient documentation

## 2020-01-16 DIAGNOSIS — R42 Dizziness and giddiness: Secondary | ICD-10-CM | POA: Diagnosis not present

## 2020-01-16 DIAGNOSIS — Z79899 Other long term (current) drug therapy: Secondary | ICD-10-CM | POA: Insufficient documentation

## 2020-01-16 DIAGNOSIS — M542 Cervicalgia: Secondary | ICD-10-CM | POA: Insufficient documentation

## 2020-01-16 DIAGNOSIS — S0101XA Laceration without foreign body of scalp, initial encounter: Secondary | ICD-10-CM | POA: Diagnosis not present

## 2020-01-16 DIAGNOSIS — R110 Nausea: Secondary | ICD-10-CM | POA: Insufficient documentation

## 2020-01-16 DIAGNOSIS — Y92009 Unspecified place in unspecified non-institutional (private) residence as the place of occurrence of the external cause: Secondary | ICD-10-CM | POA: Insufficient documentation

## 2020-01-16 DIAGNOSIS — F17210 Nicotine dependence, cigarettes, uncomplicated: Secondary | ICD-10-CM | POA: Insufficient documentation

## 2020-01-16 DIAGNOSIS — R55 Syncope and collapse: Secondary | ICD-10-CM | POA: Diagnosis not present

## 2020-01-16 DIAGNOSIS — J45909 Unspecified asthma, uncomplicated: Secondary | ICD-10-CM | POA: Insufficient documentation

## 2020-01-16 LAB — CBC
HCT: 42.2 % (ref 36.0–46.0)
Hemoglobin: 13.9 g/dL (ref 12.0–15.0)
MCH: 26.1 pg (ref 26.0–34.0)
MCHC: 32.9 g/dL (ref 30.0–36.0)
MCV: 79.2 fL — ABNORMAL LOW (ref 80.0–100.0)
Platelets: 215 10*3/uL (ref 150–400)
RBC: 5.33 MIL/uL — ABNORMAL HIGH (ref 3.87–5.11)
RDW: 14.2 % (ref 11.5–15.5)
WBC: 6.9 10*3/uL (ref 4.0–10.5)
nRBC: 0 % (ref 0.0–0.2)

## 2020-01-16 LAB — BASIC METABOLIC PANEL
Anion gap: 9 (ref 5–15)
BUN: <5 mg/dL — ABNORMAL LOW (ref 6–20)
CO2: 26 mmol/L (ref 22–32)
Calcium: 9.2 mg/dL (ref 8.9–10.3)
Chloride: 102 mmol/L (ref 98–111)
Creatinine, Ser: 0.88 mg/dL (ref 0.44–1.00)
GFR calc Af Amer: >60 mL/min (ref >60)
GFR calc non Af Amer: >60 mL/min (ref >60)
Glucose, Bld: 96 mg/dL (ref 70–99)
Potassium: 4.1 mmol/L (ref 3.5–5.1)
Sodium: 137 mmol/L (ref 135–145)

## 2020-01-16 LAB — I-STAT BETA HCG BLOOD, ED (MC, WL, AP ONLY): I-stat hCG, quantitative: <5 m[IU]/mL (ref <5)

## 2020-01-16 MED ORDER — TETRACAINE HCL 0.5 % OP SOLN
2.0000 [drp] | Freq: Once | OPHTHALMIC | Status: AC
  Administered 2020-01-16: 2 [drp] via OPHTHALMIC
  Filled 2020-01-16: qty 4

## 2020-01-16 MED ORDER — FLUORESCEIN SODIUM 1 MG OP STRP
1.0000 | ORAL_STRIP | Freq: Once | OPHTHALMIC | Status: AC
  Administered 2020-01-16: 1 via OPHTHALMIC
  Filled 2020-01-16: qty 1
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
## 2020-01-18 ENCOUNTER — Telehealth: Payer: Self-pay | Admitting: Clinical

## 2020-01-18 DIAGNOSIS — F39 Unspecified mood [affective] disorder: Secondary | ICD-10-CM

## 2020-01-18 NOTE — Addendum Note (Signed)
Addended by: Hulda Marin C on: 01/18/2020 02:06 PM   Modules accepted: Orders
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
## 2020-01-18 NOTE — Telephone Encounter (Signed)
Returned pt's call; Left HIPPA-compliant message to call back Asher Muir from [employer] for [employer] at [employer] [facility] at [phone] [facility] office).

## 2020-01-19 ENCOUNTER — Telehealth: Payer: Self-pay | Admitting: Certified Nurse Midwife

## 2020-01-19 ENCOUNTER — Other Ambulatory Visit: Payer: Self-pay | Admitting: Certified Nurse Midwife

## 2020-01-19 DIAGNOSIS — L689 Hypertrichosis, unspecified: Secondary | ICD-10-CM

## 2020-01-19 MED ORDER — EFLORNITHINE HCL 13.9 % EX CREA: 1.0000 "application " | TOPICAL_CREAM | Freq: Two times a day (BID) | CUTANEOUS | 2 refills | Status: DC

## 2020-01-19 NOTE — Progress Notes (Signed)
While discussing her recent lab results (see phone call note), pt verbalized distress over her facial hair growth. Agreed to send script for [facility] for pt to try after consulting with Dr. Donavan Foil.  Edd Arbour, CNM, MSN, [facility] 01/19/20 1:13 PM

## 2020-01-19 NOTE — Telephone Encounter (Signed)
Spoke to Ms. Iannaccone about her recent lab results - all that could cause mood disturbances are normal. Reassured her that psychiatry is a reasonable next step and encouraged her to continue with that referral and her [facility] visits. She verbalized understanding and relief that nothing is out of normal range. Edd Arbour, CNM, MSN, [facility] 01/19/20 3:06 PM

## 2020-01-25 ENCOUNTER — Telehealth: Payer: Self-pay

## 2020-01-25 ENCOUNTER — Other Ambulatory Visit: Payer: Self-pay

## 2020-01-25 ENCOUNTER — Ambulatory Visit: Payer: Medicaid Other | Admitting: Clinical

## 2020-01-25 DIAGNOSIS — L680 Hirsutism: Secondary | ICD-10-CM

## 2020-01-25 DIAGNOSIS — Z91199 Patient's noncompliance with other medical treatment and regimen due to unspecified reason: Secondary | ICD-10-CM

## 2020-01-25 DIAGNOSIS — Z30011 Encounter for initial prescription of contraceptive pills: Secondary | ICD-10-CM

## 2020-01-25 DIAGNOSIS — Z5329 Procedure and treatment not carried out because of patient's decision for other reasons: Secondary | ICD-10-CM

## 2020-01-25 MED ORDER — NORGESTIMATE-ETH ESTRADIOL 0.25-35 MG-MCG PO TABS
1.0000 | ORAL_TABLET | Freq: Every day | ORAL | 11 refills | Status: DC
Start: 2020-01-25 — End: 2020-01-25

## 2020-01-25 MED ORDER — ADAPALENE 0.3 % EX GEL: 1.0000 "application " | Freq: Every day | CUTANEOUS | 3 refills | Status: DC

## 2020-01-25 MED ORDER — NORGESTIMATE-ETH ESTRADIOL 0.25-35 MG-MCG PO TABS
1.0000 | ORAL_TABLET | Freq: Every day | ORAL | 11 refills | Status: DC
Start: 2020-01-25 — End: 2020-06-11

## 2020-01-25 NOTE — Telephone Encounter (Signed)
Called patient to discuss additional options for hirsutism. Recommended COCs for both mood stabilization, birth control (pt does not desire fertility), and the hirsutism. Pt amenable to plan - sent script for Sprintec and Differin gel to pharmacy. Pt to try these for 3-50mo and call back in if needed.  Edd Arbour, CNM, MSN, [facility] 01/25/20 6:40 PM

## 2020-01-25 NOTE — Addendum Note (Signed)
Addended by: Edd Arbour on: 01/25/2020 06:41 PM   Modules accepted: Orders

## 2020-01-25 NOTE — Telephone Encounter (Signed)
Pt called and stated that she was prescribed a face cream that her insurance does not cover if she could please have the other medication that she said she could prescribe.  Message routed to [facility] for advisement.    Addison Naegeli, RN  01/25/20

## 2020-02-02 ENCOUNTER — Other Ambulatory Visit: Payer: Self-pay

## 2020-02-02 ENCOUNTER — Encounter (HOSPITAL_COMMUNITY): Payer: Self-pay | Admitting: Licensed Clinical Social Worker

## 2020-02-02 ENCOUNTER — Ambulatory Visit (HOSPITAL_COMMUNITY): Payer: Medicaid Other | Admitting: Licensed Clinical Social Worker

## 2020-02-02 DIAGNOSIS — F4310 Post-traumatic stress disorder, unspecified: Secondary | ICD-10-CM

## 2020-02-02 DIAGNOSIS — F331 Major depressive disorder, recurrent, moderate: Secondary | ICD-10-CM
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
## 2020-02-27 ENCOUNTER — Telehealth (HOSPITAL_COMMUNITY): Payer: Medicaid Other | Admitting: Adult Health

## 2020-03-01 ENCOUNTER — Telehealth (HOSPITAL_COMMUNITY): Payer: Medicaid Other

## 2020-03-14 ENCOUNTER — Other Ambulatory Visit: Payer: Self-pay

## 2020-03-14 ENCOUNTER — Telehealth (HOSPITAL_COMMUNITY): Payer: Medicaid Other | Admitting: Psychiatry

## 2020-04-06 ENCOUNTER — Other Ambulatory Visit: Payer: Self-pay

## 2020-04-06 ENCOUNTER — Ambulatory Visit (INDEPENDENT_AMBULATORY_CARE_PROVIDER_SITE_OTHER): Payer: Medicaid Other | Admitting: Licensed Clinical Social Worker

## 2020-04-06 DIAGNOSIS — F339 Major depressive disorder, recurrent, unspecified: Secondary | ICD-10-CM | POA: Insufficient documentation

## 2020-04-06 DIAGNOSIS — F331 Major depressive disorder, recurrent, moderate: Secondary | ICD-10-CM

## 2020-04-06 NOTE — Progress Notes (Signed)
   THERAPIST PROGRESS NOTE  Virtual Visit via Video Note  I connected with [name] on 04/06/20 at 11:00 AM EDT by a video enabled telemedicine application and verified that I am speaking with the correct person using two identifiers.  Location: Patient: Home [facility]  Provider: [facility]    I discussed the limitations of evaluation and management by telemedicine and the availability of in person appointments. The patient expressed understanding and agreed to proceed.  Therapist Response:     Subjective/Objective:  Pt was alert and oriented x 5. She was dressed casually and engaged well throughout therapy as evidence by note below. Pt presented with depressed and tearful mood/affect. Kerstin was cooperative and had fleeting eye contact.   Pt reports her primary stressor is family conflict and illness. She Missed her last two medication evals through [facility]. Pt reports her first eval was scheduled for a Saturday and provider did not show up. Laporsha did reschedule this appt, however during her 2nd scheduled appt her phone died, and she missed that as well. She is now rescheduled for Nov 23rd with Dr. Doyne Keel. Pt reports and increase in her depressive symptoms since her initial therapy eval. Pt states that she is very irritable. Stating that her family and friends seem to keep wanting things for her and provided the example of money as the primary nee asked for. LCSW used supportive solution focused therapy and stated to pt that proper boundaries needed to be set for the family. Pt needs to determine the extent those boundaries need to go.   Onnie reports that she is also having trouble completing tasks. She has a list of things that need to be completed throughout the month of Nov. She reports this list is very overwhelming LCSW again utilized solution focused and CBT therapy to help pt partialize that list into daily goals instead of monthly. She  reports a relief in tension intervention was completed.      Assessment/plan: Pt endorses symptoms for depression as insomnia, irritability, poor appetite, tension, worry, hopelessness and worthlessness. Currently pt meets criteria for MDD. Currently pt is not taking any medication but she is referred and is scheduled for medication appt Nov 23rd. Plan for therapy: Pt to break down monthly goal and task list into a daily list throughout the month of Sept. She will also begin to brainstorm boundaries that need to be set for her friends and family and present them at the next therapy session.    I discussed the assessment and treatment plan with the patient. The patient was provided an opportunity to ask questions and all were answered. The patient agreed with the plan and demonstrated an understanding of the instructions.   The patient was advised to call back or seek an in-person evaluation if the symptoms worsen or if the condition fails to improve as anticipated.  I provided 45 minutes of non-face-to-face time during this encounter.   Weber Cooks, LCSW  Participation Level: Active  Behavioral Response: Casual and DisheveledAlertAnxious and Depressed  Type of Therapy: Individual Therapy  Treatment Goals addressed: Diagnosis: MDD  Interventions: CBT and Supportive  Summary: GLADIE GRAVETTE is a 29 y.o. female who presents with MDD.   Suicidal/Homicidal: NAwithout intent/plan   Plan: Return again in 2 weeks.      Weber Cooks, LCSW 04/06/2020

## 2020-04-20 ENCOUNTER — Ambulatory Visit (HOSPITAL_COMMUNITY): Payer: Medicaid Other | Admitting: Licensed Clinical Social Worker

## 2020-04-20 ENCOUNTER — Other Ambulatory Visit: Payer: Self-pay

## 2020-04-20 ENCOUNTER — Telehealth (HOSPITAL_COMMUNITY): Payer: Self-pay | Admitting: Licensed Clinical Social Worker

## 2020-04-20 NOTE — Telephone Encounter (Signed)
LCSW sent links for pt for virtual visit. Pt did enter video conference but stated that her two children and herself have been fighting an illness for the past several days. Pt is not sure if it is covid, but her children do need to be tested before returning to school. Gail Robinson asked if session could be conducted for her next schedule visit in two weeks as she was not feeling well. LCSW was agreeable to that. Pt was reminded of her 11/23 medication mgmt. appt as well as therapy for 11/24

## 2020-05-03 ENCOUNTER — Other Ambulatory Visit: Payer: Self-pay

## 2020-05-03 ENCOUNTER — Telehealth (INDEPENDENT_AMBULATORY_CARE_PROVIDER_SITE_OTHER): Payer: Medicaid Other | Admitting: Psychiatry

## 2020-05-03 ENCOUNTER — Encounter (HOSPITAL_COMMUNITY): Payer: Self-pay | Admitting: Psychiatry

## 2020-05-03 DIAGNOSIS — F3130 Bipolar disorder, current episode depressed, mild or moderate severity, unspecified: Secondary | ICD-10-CM

## 2020-05-03 DIAGNOSIS — F411 Generalized anxiety disorder: Secondary | ICD-10-CM

## 2020-05-03 MED ORDER — ARIPIPRAZOLE 5 MG PO TABS: 5.0000 mg | ORAL_TABLET | Freq: Every day | ORAL | 2 refills | Status: DC

## 2020-05-03 MED ORDER — HYDROXYZINE HCL 10 MG PO TABS: 10.0000 mg | ORAL_TABLET | Freq: Three times a day (TID) | ORAL | 2 refills | Status: DC | PRN

## 2020-05-03 MED ORDER — TRAZODONE HCL 50 MG PO TABS: 50.0000 mg | ORAL_TABLET | Freq: Every evening | ORAL | 2 refills | Status: DC | PRN
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
## 2020-05-04 ENCOUNTER — Other Ambulatory Visit: Payer: Self-pay

## 2020-05-04 ENCOUNTER — Ambulatory Visit (HOSPITAL_COMMUNITY): Payer: Medicaid Other | Admitting: Licensed Clinical Social Worker

## 2020-05-04 ENCOUNTER — Telehealth (HOSPITAL_COMMUNITY): Payer: Self-pay | Admitting: Licensed Clinical Social Worker

## 2020-05-04 NOTE — Telephone Encounter (Signed)
LCSW sent two links to pt phone listed as primary contact in Epic. Chat room was never entered by pt. LCSW f/u with phone call to same number provided in Epic and left HIPAA compliant VM.

## 2020-06-11 ENCOUNTER — Inpatient Hospital Stay (HOSPITAL_COMMUNITY)
Admission: AD | Admit: 2020-06-11 | Discharge: 2020-06-11 | Disposition: A | Discharge status: Home / Self Care | Payer: Medicaid Other | Attending: Obstetrics & Gynecology | Admitting: Obstetrics & Gynecology

## 2020-06-11 ENCOUNTER — Other Ambulatory Visit: Payer: Self-pay

## 2020-06-11 ENCOUNTER — Encounter (HOSPITAL_COMMUNITY): Payer: Self-pay

## 2020-06-11 DIAGNOSIS — Z3201 Encounter for pregnancy test, result positive: Secondary | ICD-10-CM | POA: Diagnosis not present

## 2020-06-11 DIAGNOSIS — Z79899 Other long term (current) drug therapy: Secondary | ICD-10-CM | POA: Insufficient documentation

## 2020-06-11 DIAGNOSIS — Z3200 Encounter for pregnancy test, result unknown: Secondary | ICD-10-CM | POA: Diagnosis not present

## 2020-06-11 DIAGNOSIS — N9489 Other specified conditions associated with female genital organs and menstrual cycle: Secondary | ICD-10-CM | POA: Insufficient documentation

## 2020-06-11 DIAGNOSIS — F17210 Nicotine dependence, cigarettes, uncomplicated: Secondary | ICD-10-CM | POA: Diagnosis not present

## 2020-06-11 DIAGNOSIS — N926 Irregular menstruation, unspecified: Secondary | ICD-10-CM

## 2020-06-11 LAB — URINALYSIS, ROUTINE W REFLEX MICROSCOPIC
Bacteria, UA: NONE SEEN
Bilirubin Urine: NEGATIVE
Glucose, UA: NEGATIVE mg/dL
Hgb urine dipstick: NEGATIVE
Ketones, ur: NEGATIVE mg/dL
Nitrite: NEGATIVE
Protein, ur: NEGATIVE mg/dL
Specific Gravity, Urine: 1.015 (ref 1.005–1.030)
pH: 6 (ref 5.0–8.0)

## 2020-06-11 LAB — POCT PREGNANCY, URINE: Preg Test, Ur: POSITIVE — AB

## 2020-06-11 NOTE — MAU Provider Note (Signed)
Chief Complaint: Possible Pregnancy    SUBJECTIVE HPI: Gail Robinson is a 30 y.o. [id number] who presents to maternity admissions for pregnancy confirmation. She denies vaginal bleeding or abdominal pain today.   Past Medical History:  Diagnosis Date  . Asthma    childhood   Past Surgical History:  Procedure Laterality Date  . NO PAST SURGERIES     Social History   Socioeconomic History  . Marital status: Single    Spouse name: Not on file  . Number of children: Not on file  . Years of education: Not on file  . Highest education level: Not on file  Occupational History  . Not on file  Tobacco Use  . Smoking status: Current Some Day Smoker    Packs/day: 0.30    Types: Cigarettes    Last attempt to quit: 12/23/2013    Years since quitting: 6.4  . Smokeless tobacco: Never Used  Substance and Sexual Activity  . Alcohol use: Not Currently    Comment: 1 pint to 2 pints alcohol per week.   . Drug use: Yes    Types: Marijuana    Comment: 1 joint per day  0.5 grams   . Sexual activity: Not Currently  Other Topics Concern  . Not on file  Social History Narrative  . Not on file   Social Determinants of Health   Financial Resource Strain: Low Risk   . Difficulty of Paying Living Expenses: Not hard at all  Food Insecurity: No Food Insecurity  . Worried About Programme researcher, broadcasting/film/video in the Last Year: Never true  . Ran Out of Food in the Last Year: Never true  Transportation Needs: No Transportation Needs  . Lack of Transportation (Medical): No  . Lack of Transportation (Non-Medical): No  Physical Activity: Sufficiently Active  . Days of Exercise per Week: 3 days  . Minutes of Exercise per Session: 60 min  Stress: Stress Concern Present  . Feeling of Stress : Rather much  Social Connections: Socially Isolated  . Frequency of Communication with Friends and Family: Never  . Frequency of Social Gatherings with Friends and Family: Never  . Attends Religious Services: Never   . Active Member of Clubs or Organizations: No  . Attends Banker Meetings: Never  . Marital Status: Living with partner  Intimate Partner Violence: Not At Risk  . Fear of Current or Ex-Partner: No  . Emotionally Abused: No  . Physically Abused: No  . Sexually Abused: No   No current facility-administered medications on file prior to encounter.   Current Outpatient Medications on File Prior to Encounter  Medication Sig Dispense Refill  . albuterol (PROVENTIL HFA;VENTOLIN HFA) 108 (90 BASE) MCG/ACT inhaler Inhale 1-2 puffs into the lungs every 6 (six) hours as needed for wheezing or shortness of breath. (Patient not taking: Reported on 01/05/2020) 1 Inhaler 12  . albuterol (PROVENTIL) (2.5 MG/3ML) 0.083% nebulizer solution Take 3 mLs (2.5 mg total) by nebulization every 6 (six) hours as needed for wheezing. (Patient not taking: Reported on 01/05/2020) 75 mL 12  . ARIPiprazole (ABILIFY) 5 MG tablet Take 1 tablet (5 mg total) by mouth daily. 30 tablet 2  . Biotin w/ Vitamins C & E (HAIR/SKIN/NAILS PO) Take by mouth.    . hydrOXYzine (ATARAX/VISTARIL) 10 MG tablet Take 1 tablet (10 mg total) by mouth 3 (three) times daily as needed. 90 tablet 2  . Magnesium Oxide (MAG-OXIDE) 200 MG TABS Take 2 tablets (400 mg total) by  mouth at bedtime. If that amount causes loose stools in the am, switch to 200mg  daily at bedtime. 60 tablet 3  . Multiple Vitamins-Minerals (WOMENS ONE DAILY PO) Take by mouth.    . traZODone (DESYREL) 50 MG tablet Take 1 tablet (50 mg total) by mouth at bedtime as needed for sleep. 30 tablet 2  . [DISCONTINUED] beclomethasone (QVAR) 80 MCG/ACT inhaler Inhale 2 puffs into the lungs 2 (two) times daily. (Patient not taking: Reported on 04/09/2018) 1 Inhaler 12   No Known Allergies  ROS:  Review of Systems  Constitutional: Negative for fever.  Gastrointestinal: Negative for abdominal pain.  Genitourinary: Negative for vaginal bleeding and vaginal discharge.    I  have reviewed patient's Past Medical Hx, Surgical Hx, Family Hx, Social Hx, medications and allergies.   Physical Exam   Patient Vitals for the past 24 hrs:  BP Temp Temp src Pulse Resp SpO2  06/11/20 1412 109/68 98.4 F (36.9 C) Oral 93 17 100 %   Physical Exam Constitutional:      General: She is not in acute distress.    Appearance: Normal appearance.  Neurological:     Mental Status: She is alert.  Psychiatric:        Behavior: Behavior normal.     ASSESSMENT 1. Encounter for pregnancy test, result unknown   2. Missed menses     PLAN Discharge home in stable condition First trimester/ectopic precautions discussed Patient advised to follow-up with [facility] for pregnancy test- appt if needed. Patient may return to MAU as needed or if her condition were to change or worsen   Delesia Martinek, 08/09/20, NP 06/11/2020 6:50 PM

## 2020-06-11 NOTE — MAU Note (Signed)
Pt reports to mau after taking 8 hpt over the last few days.  Pt reports all 8 were positive.  Pt states she had a miscarriage a few months ago and just wanted to make sure everything was ok.  Pt reports some back pain a few days ago but no pain today.  Pt denies vag bleeding.

## 2020-07-06 ENCOUNTER — Telehealth: Payer: Self-pay

## 2020-07-06 NOTE — Telephone Encounter (Signed)
Called patient and was unable to leave message mail was full.

## 2020-07-24 ENCOUNTER — Other Ambulatory Visit (HOSPITAL_COMMUNITY): Payer: Self-pay | Admitting: Psychiatry

## 2020-07-24 DIAGNOSIS — F411 Generalized anxiety disorder: Secondary | ICD-10-CM

## 2020-08-03 ENCOUNTER — Telehealth (HOSPITAL_COMMUNITY): Payer: Medicaid Other | Admitting: Psychiatry

## 2020-08-03 ENCOUNTER — Other Ambulatory Visit: Payer: Self-pay

## 2020-08-03 ENCOUNTER — Encounter: Payer: Medicaid Other | Admitting: Student

## 2020-10-27 ENCOUNTER — Ambulatory Visit (HOSPITAL_BASED_OUTPATIENT_CLINIC_OR_DEPARTMENT_OTHER): Payer: Medicaid Other | Admitting: Family Medicine

## 2020-11-04 ENCOUNTER — Ambulatory Visit (HOSPITAL_BASED_OUTPATIENT_CLINIC_OR_DEPARTMENT_OTHER): Payer: Medicaid Other | Admitting: Family Medicine

## 2020-11-04 ENCOUNTER — Encounter (HOSPITAL_BASED_OUTPATIENT_CLINIC_OR_DEPARTMENT_OTHER): Payer: Self-pay

## 2020-11-10 ENCOUNTER — Ambulatory Visit (HOSPITAL_BASED_OUTPATIENT_CLINIC_OR_DEPARTMENT_OTHER): Payer: Medicaid Other | Admitting: Family Medicine

## 2020-11-14 ENCOUNTER — Encounter (HOSPITAL_BASED_OUTPATIENT_CLINIC_OR_DEPARTMENT_OTHER): Payer: Self-pay | Admitting: Family Medicine

## 2021-02-03 ENCOUNTER — Inpatient Hospital Stay (HOSPITAL_COMMUNITY)
Admission: AD | Admit: 2021-02-03 | Discharge: 2021-02-03 | Disposition: A | Discharge status: Home / Self Care | Payer: Medicaid Other | Attending: Obstetrics & Gynecology | Admitting: Obstetrics & Gynecology

## 2021-02-03 ENCOUNTER — Other Ambulatory Visit: Payer: Self-pay

## 2021-02-03 ENCOUNTER — Inpatient Hospital Stay (HOSPITAL_COMMUNITY): Payer: Medicaid Other

## 2021-02-03 ENCOUNTER — Encounter (HOSPITAL_COMMUNITY): Payer: Self-pay | Admitting: Obstetrics & Gynecology

## 2021-02-03 DIAGNOSIS — O99891 Other specified diseases and conditions complicating pregnancy: Secondary | ICD-10-CM

## 2021-02-03 DIAGNOSIS — O99331 Smoking (tobacco) complicating pregnancy, first trimester: Secondary | ICD-10-CM | POA: Diagnosis not present

## 2021-02-03 DIAGNOSIS — N939 Abnormal uterine and vaginal bleeding, unspecified: Secondary | ICD-10-CM

## 2021-02-03 DIAGNOSIS — Z3A08 8 weeks gestation of pregnancy: Secondary | ICD-10-CM | POA: Diagnosis not present

## 2021-02-03 DIAGNOSIS — R109 Unspecified abdominal pain: Secondary | ICD-10-CM | POA: Diagnosis not present

## 2021-02-03 DIAGNOSIS — O26891 Other specified pregnancy related conditions, first trimester: Secondary | ICD-10-CM | POA: Diagnosis not present

## 2021-02-03 DIAGNOSIS — O209 Hemorrhage in early pregnancy, unspecified: Secondary | ICD-10-CM | POA: Insufficient documentation

## 2021-02-03 DIAGNOSIS — F17210 Nicotine dependence, cigarettes, uncomplicated: Secondary | ICD-10-CM | POA: Diagnosis not present

## 2021-02-03 DIAGNOSIS — N760 Acute vaginitis: Secondary | ICD-10-CM | POA: Diagnosis not present

## 2021-02-03 DIAGNOSIS — O9A311 Physical abuse complicating pregnancy, first trimester: Secondary | ICD-10-CM | POA: Diagnosis not present

## 2021-02-03 DIAGNOSIS — R102 Pelvic and perineal pain: Secondary | ICD-10-CM | POA: Insufficient documentation

## 2021-02-03 DIAGNOSIS — B9689 Other specified bacterial agents as the cause of diseases classified elsewhere: Secondary | ICD-10-CM | POA: Diagnosis not present

## 2021-02-03 DIAGNOSIS — O23591 Infection of other part of genital tract in pregnancy, first trimester: Secondary | ICD-10-CM | POA: Diagnosis not present

## 2021-02-03 DIAGNOSIS — O4691 Antepartum hemorrhage, unspecified, first trimester: Secondary | ICD-10-CM | POA: Diagnosis not present

## 2021-02-03 DIAGNOSIS — Z3490 Encounter for supervision of normal pregnancy, unspecified, unspecified trimester: Secondary | ICD-10-CM

## 2021-02-03 LAB — COMPREHENSIVE METABOLIC PANEL
ALT: 14 U/L (ref 0–44)
AST: 17 U/L (ref 15–41)
Albumin: 3.8 g/dL (ref 3.5–5.0)
Alkaline Phosphatase: 72 U/L (ref 38–126)
Anion gap: 9 (ref 5–15)
BUN: 9 mg/dL (ref 6–20)
CO2: 24 mmol/L (ref 22–32)
Calcium: 9.2 mg/dL (ref 8.9–10.3)
Chloride: 102 mmol/L (ref 98–111)
Creatinine, Ser: 0.78 mg/dL (ref 0.44–1.00)
GFR, Estimated: >60 mL/min (ref >60)
Glucose, Bld: 95 mg/dL (ref 70–99)
Potassium: 4 mmol/L (ref 3.5–5.1)
Sodium: 135 mmol/L (ref 135–145)
Total Bilirubin: 0.7 mg/dL (ref 0.3–1.2)
Total Protein: 6.9 g/dL (ref 6.5–8.1)

## 2021-02-03 LAB — URINALYSIS, ROUTINE W REFLEX MICROSCOPIC
Bilirubin Urine: NEGATIVE
Glucose, UA: NEGATIVE mg/dL
Hgb urine dipstick: NEGATIVE
Ketones, ur: 5 mg/dL — AB
Leukocytes,Ua: NEGATIVE
Nitrite: NEGATIVE
Protein, ur: NEGATIVE mg/dL
Specific Gravity, Urine: 1.03 (ref 1.005–1.030)
pH: 5 (ref 5.0–8.0)

## 2021-02-03 LAB — POCT PREGNANCY, URINE: Preg Test, Ur: POSITIVE — AB

## 2021-02-03 LAB — CBC
HCT: 44.3 % (ref 36.0–46.0)
Hemoglobin: 14.4 g/dL (ref 12.0–15.0)
MCH: 25.8 pg — ABNORMAL LOW (ref 26.0–34.0)
MCHC: 32.5 g/dL (ref 30.0–36.0)
MCV: 79.2 fL — ABNORMAL LOW (ref 80.0–100.0)
Platelets: 230 10*3/uL (ref 150–400)
RBC: 5.59 MIL/uL — ABNORMAL HIGH (ref 3.87–5.11)
RDW: 14.2 % (ref 11.5–15.5)
WBC: 10.5 10*3/uL (ref 4.0–10.5)
nRBC: 0 % (ref 0.0–0.2)

## 2021-02-03 LAB — WET PREP, GENITAL
Sperm: NONE SEEN
Trich, Wet Prep: NONE SEEN
Yeast Wet Prep HPF POC: NONE SEEN

## 2021-02-03 LAB — HCG, QUANTITATIVE, PREGNANCY: hCG, Beta Chain, Quant, S: 31621 m[IU]/mL — ABNORMAL HIGH (ref <5)

## 2021-02-03 MED ORDER — METRONIDAZOLE 500 MG PO TABS: 500.0000 mg | ORAL_TABLET | Freq: Two times a day (BID) | ORAL | 0 refills | Status: DC
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
## 2021-02-06 LAB — GC/CHLAMYDIA PROBE AMP (~~LOC~~) NOT AT ARMC
Chlamydia: NEGATIVE
Comment: NEGATIVE
Comment: NORMAL
Neisseria Gonorrhea: NEGATIVE

## 2021-02-23 ENCOUNTER — Ambulatory Visit: Payer: Medicaid Other | Admitting: Clinical

## 2021-02-23 ENCOUNTER — Telehealth: Payer: Self-pay | Admitting: Lactation Services

## 2021-02-23 DIAGNOSIS — Z7189 Other specified counseling: Secondary | ICD-10-CM

## 2021-02-23 MED ORDER — PRENATAL PLUS 27-1 MG PO TABS: 1.0000 | ORAL_TABLET | Freq: Every day | ORAL | 11 refills | Status: DC

## 2021-02-23 NOTE — Patient Instructions (Signed)
[facility] [address] [place], [place] [address] [phone] (main office) [phone] (Zahari Xiang's office)   
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
## 2021-02-23 NOTE — BH Specialist Note (Signed)
Less than 15 minute phone visit with pt, feeling anxious over current early pregnancy; requests follow up virtual visit, and community resources for support. Pt was previously being treated for mood instability with depression and anxiety via psychiatry and BH medication; has moved since that time.

## 2021-02-24 NOTE — BH Specialist Note (Signed)
Pt did not arrive to video visit and did not answer the phone; Left HIPPA-compliant message to call back Dmitri Pettigrew from [facility] at  [phone] (Mckinzee Spirito's office).  ?; left MyChart message for patient.  ? ?

## 2021-03-09 ENCOUNTER — Ambulatory Visit: Payer: Medicaid Other | Admitting: Clinical

## 2021-03-09 DIAGNOSIS — Z91199 Patient's noncompliance with other medical treatment and regimen due to unspecified reason: Secondary | ICD-10-CM

## 2021-04-21 ENCOUNTER — Other Ambulatory Visit: Payer: Self-pay

## 2021-04-21 ENCOUNTER — Encounter: Payer: Self-pay | Admitting: Family

## 2021-04-21 ENCOUNTER — Other Ambulatory Visit (HOSPITAL_COMMUNITY)
Admission: RE | Admit: 2021-04-21 | Discharge: 2021-04-21 | Disposition: A | Discharge status: Home / Self Care | Payer: Medicaid Other | Source: Ambulatory Visit | Attending: Family | Admitting: Family

## 2021-04-21 ENCOUNTER — Ambulatory Visit (INDEPENDENT_AMBULATORY_CARE_PROVIDER_SITE_OTHER): Payer: Medicaid Other | Admitting: Family

## 2021-04-21 VITALS — BP 100/65 | HR 82 | Wt 221.9 lb

## 2021-04-21 DIAGNOSIS — Z3491 Encounter for supervision of normal pregnancy, unspecified, first trimester: Secondary | ICD-10-CM | POA: Insufficient documentation

## 2021-04-21 DIAGNOSIS — Z87891 Personal history of nicotine dependence: Secondary | ICD-10-CM | POA: Diagnosis not present

## 2021-04-21 DIAGNOSIS — Z23 Encounter for immunization: Secondary | ICD-10-CM

## 2021-04-21 DIAGNOSIS — F419 Anxiety disorder, unspecified: Secondary | ICD-10-CM

## 2021-04-21 DIAGNOSIS — Z3490 Encounter for supervision of normal pregnancy, unspecified, unspecified trimester: Secondary | ICD-10-CM | POA: Insufficient documentation

## 2021-04-21 DIAGNOSIS — R002 Palpitations: Secondary | ICD-10-CM

## 2021-04-21 MED ORDER — GOJJI WEIGHT SCALE MISC: 1.0000 | 0 refills | Status: DC | PRN

## 2021-04-21 MED ORDER — BLOOD PRESSURE KIT DEVI: 1.0000 | 0 refills | Status: DC | PRN

## 2021-04-21 NOTE — Progress Notes (Signed)
Patient in for new OB visit Concerns for medications of what she is able to take, stop taking in August: Abilify Vistaril Trazodone  Needing refills on all of these medications as well as her inhaler if possible.  Would like genetic testing, patient is also due for her Pap today. Reports extreme nausea and vomiting. Patient reports headaches as well as "heart flutters". States she has noticed an increase in this since becoming pregnant as well as quitting tobacco. Reports smoking marijuana daily "a blunt a day".    Wynona Canes, CMA
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
## 2021-04-22 LAB — CBC/D/PLT+RPR+RH+ABO+RUBIGG...
Antibody Screen: NEGATIVE
Basophils Absolute: 0.1 10*3/uL (ref 0.0–0.2)
Basos: 1 %
EOS (ABSOLUTE): 0.4 10*3/uL (ref 0.0–0.4)
Eos: 4 %
HCV Ab: <0.1 s/co ratio (ref 0.0–0.9)
HIV Screen 4th Generation wRfx: NONREACTIVE
Hematocrit: 39.6 % (ref 34.0–46.6)
Hemoglobin: 13.3 g/dL (ref 11.1–15.9)
Hepatitis B Surface Ag: NEGATIVE
Immature Grans (Abs): 0.1 10*3/uL (ref 0.0–0.1)
Immature Granulocytes: 1 %
Lymphocytes Absolute: 2.8 10*3/uL (ref 0.7–3.1)
Lymphs: 28 %
MCH: 25.8 pg — ABNORMAL LOW (ref 26.6–33.0)
MCHC: 33.6 g/dL (ref 31.5–35.7)
MCV: 77 fL — ABNORMAL LOW (ref 79–97)
Monocytes Absolute: 0.7 10*3/uL (ref 0.1–0.9)
Monocytes: 7 %
Neutrophils Absolute: 5.8 10*3/uL (ref 1.4–7.0)
Neutrophils: 59 %
Platelets: 207 10*3/uL (ref 150–450)
RBC: 5.16 x10E6/uL (ref 3.77–5.28)
RDW: 13.5 % (ref 11.7–15.4)
RPR Ser Ql: NONREACTIVE
Rh Factor: POSITIVE
Rubella Antibodies, IGG: 7.91 index (ref >0.99)
WBC: 9.8 10*3/uL (ref 3.4–10.8)

## 2021-04-22 LAB — HEPATITIS C ANTIBODY: Hep C Virus Ab: <0.1 s/co ratio (ref 0.0–0.9)

## 2021-04-22 LAB — HCV INTERPRETATION

## 2021-04-23 LAB — URINE CULTURE, OB REFLEX

## 2021-04-23 LAB — CULTURE, OB URINE

## 2021-04-24 ENCOUNTER — Encounter: Payer: Self-pay | Admitting: *Deleted

## 2021-04-24 LAB — CERVICOVAGINAL ANCILLARY ONLY
Bacterial Vaginitis (gardnerella): NEGATIVE
Candida Glabrata: NEGATIVE
Candida Vaginitis: POSITIVE — AB
Chlamydia: NEGATIVE
Comment: NEGATIVE
Comment: NEGATIVE
Comment: NEGATIVE
Comment: NEGATIVE
Comment: NEGATIVE
Comment: NORMAL
Neisseria Gonorrhea: NEGATIVE
Trichomonas: NEGATIVE

## 2021-04-26 ENCOUNTER — Telehealth: Payer: Self-pay

## 2021-04-26 DIAGNOSIS — Z87891 Personal history of nicotine dependence: Secondary | ICD-10-CM

## 2021-04-26 LAB — CYTOLOGY - PAP
Chlamydia: NEGATIVE
Comment: NEGATIVE
Comment: NEGATIVE
Comment: NORMAL
Diagnosis: NEGATIVE
High risk HPV: NEGATIVE
Neisseria Gonorrhea: NEGATIVE

## 2021-04-26 MED ORDER — TERCONAZOLE 0.4 % VA CREA: 1.0000 | TOPICAL_CREAM | Freq: Every day | VAGINAL | 0 refills | Status: AC

## 2021-04-26 NOTE — Telephone Encounter (Signed)
Call returned to pt. Spoke with pt. Pt states seeing results of + yeast on labs from 11/11. Pt advised will send in Rx Terazol 7 vaginal cream to pharmacy for treatment. Pt agreeable to plan of care and verbalized understanding.   Gail Robinson

## 2021-04-26 NOTE — Telephone Encounter (Signed)
Patient called nurse phone and requests results from recent visit. Explained we will contact pt with results.

## 2021-04-26 NOTE — Addendum Note (Signed)
Addended by: Isabell Jarvis on: 04/26/2021 04:48 PM   Modules accepted: Orders

## 2021-04-26 NOTE — Addendum Note (Signed)
Addended by: Isabell Jarvis on: 04/26/2021 04:15 PM   Modules accepted: Orders

## 2021-04-27 NOTE — BH Specialist Note (Signed)
Pt did not arrive to video visit and did not answer the phone; Left HIPPA-compliant message to call back Erle Guster from [facility] at  [phone] (Deklyn Gibbon's office).  ?; left MyChart message for patient.  ? ?

## 2021-05-10 ENCOUNTER — Ambulatory Visit: Payer: Medicaid Other | Admitting: Clinical

## 2021-05-10 DIAGNOSIS — Z91199 Patient's noncompliance with other medical treatment and regimen due to unspecified reason: Secondary | ICD-10-CM

## 2021-05-12 ENCOUNTER — Ambulatory Visit: Payer: Medicaid Other | Attending: Family

## 2021-05-12 ENCOUNTER — Other Ambulatory Visit: Payer: Self-pay

## 2021-05-12 ENCOUNTER — Ambulatory Visit: Payer: Medicaid Other | Admitting: Cardiology

## 2021-05-12 ENCOUNTER — Ambulatory Visit (INDEPENDENT_AMBULATORY_CARE_PROVIDER_SITE_OTHER): Payer: Medicaid Other | Admitting: Advanced Practice Midwife

## 2021-05-12 VITALS — BP 107/71 | HR 86 | Wt 225.0 lb

## 2021-05-12 DIAGNOSIS — O352XX0 Maternal care for (suspected) hereditary disease in fetus, not applicable or unspecified: Secondary | ICD-10-CM | POA: Diagnosis not present

## 2021-05-12 DIAGNOSIS — O99212 Obesity complicating pregnancy, second trimester: Secondary | ICD-10-CM | POA: Insufficient documentation

## 2021-05-12 DIAGNOSIS — Z3A20 20 weeks gestation of pregnancy: Secondary | ICD-10-CM | POA: Insufficient documentation

## 2021-05-12 DIAGNOSIS — O99322 Drug use complicating pregnancy, second trimester: Secondary | ICD-10-CM | POA: Diagnosis not present

## 2021-05-12 DIAGNOSIS — Z3480 Encounter for supervision of other normal pregnancy, unspecified trimester: Secondary | ICD-10-CM

## 2021-05-12 DIAGNOSIS — Z3491 Encounter for supervision of normal pregnancy, unspecified, first trimester: Secondary | ICD-10-CM | POA: Diagnosis not present

## 2021-05-12 DIAGNOSIS — Z363 Encounter for antenatal screening for malformations: Secondary | ICD-10-CM | POA: Insufficient documentation

## 2021-05-12 DIAGNOSIS — Z3492 Encounter for supervision of normal pregnancy, unspecified, second trimester: Secondary | ICD-10-CM

## 2021-05-12 NOTE — Progress Notes (Signed)
Patient reports concerns of baby's movement. She stated that she will fell movements "here and there" but not as much as she did with her previous pregnancies and wanted to know if her "coming off cigarettes" have something to do with the "lack" of movements.  Fetal heart tones: 150  Ignatz Deis, CMA   05/12/21

## 2021-05-12 NOTE — Progress Notes (Signed)
   PRENATAL VISIT NOTE  Subjective:  Gail Robinson is a 30 y.o. [id number] at [redacted]w[redacted]d being seen today for ongoing prenatal care.  She is currently monitored for the following issues for this low-risk pregnancy and has Chlamydia infection; History of fetal anomaly in prior pregnancy, currently pregnant; Major depressive disorder, recurrent episode (HCC); Bipolar I disorder, most recent episode depressed (HCC); Generalized anxiety disorder; and Supervision of low-risk pregnancy on their problem list.  Patient reports no complaints.  Contractions: Not present. Vag. Bleeding: None.  Movement: Present. Denies leaking of fluid.   The following portions of the patient's history were reviewed and updated as appropriate: allergies, current medications, past family history, past medical history, past social history, past surgical history and problem list.   Objective:   Vitals:   05/12/21 1115  BP: 107/71  Pulse: 86  Weight: 102.1 kg    Fetal Status: Fetal Heart Rate (bpm): 150   Movement: Present     General:  Alert, oriented and cooperative. Patient is in no acute distress.  Skin: Skin is warm and dry. No rash noted.   Cardiovascular: Normal heart rate noted  Respiratory: Normal respiratory effort, no problems with respiration noted  Abdomen: Soft, gravid, appropriate for gestational age.  Pain/Pressure: Present     Pelvic: Cervical exam deferred        Extremities: Normal range of motion.     Mental Status: Normal mood and affect. Normal behavior. Normal judgment and thought content.   Assessment and Plan:  Pregnancy: [id number] at [redacted]w[redacted]d 1. Supervision of other normal pregnancy, antepartum - routine care  2. [redacted] weeks gestation of pregnancy - anatomy ultrasound later today  - AFP today - Need to re-draw panorama due to low fetal fraction  Preterm labor symptoms and general obstetric precautions including but not limited to vaginal bleeding, contractions, leaking of fluid and fetal  movement were reviewed in detail with the patient. Please refer to After Visit Summary for other counseling recommendations.   Return in about 4 weeks (around 06/09/2021).  Future Appointments  Date Time Provider Department Center  05/12/2021  1:30 PM [facility] US3 [facility]  05/12/2021  3:00 PM Thomasene Ripple, DO [facility] None   Thressa Sheller DNP, CNM  05/12/21  11:39 AM

## 2021-05-13 ENCOUNTER — Encounter: Payer: Self-pay | Admitting: Advanced Practice Midwife

## 2021-05-13 LAB — AFP, SERUM, OPEN SPINA BIFIDA
AFP MoM: 0.89
AFP Value: 45.3 ng/mL
Gest. Age on Collection Date: 20 weeks
Maternal Age At EDD: 31.5 yr
OSBR Risk 1 IN: 10000
Test Results:: NEGATIVE
Weight: 225 [lb_av]

## 2021-05-15 ENCOUNTER — Other Ambulatory Visit: Payer: Self-pay | Admitting: *Deleted

## 2021-05-15 DIAGNOSIS — R638 Other symptoms and signs concerning food and fluid intake: Secondary | ICD-10-CM

## 2021-05-22 ENCOUNTER — Encounter: Payer: Self-pay | Admitting: Lactation Services

## 2021-05-22 ENCOUNTER — Telehealth: Payer: Self-pay | Admitting: Lactation Services

## 2021-05-22 NOTE — Telephone Encounter (Signed)
Called patient to let he know that her Gail Robinson was not able to be resulted and will need to have redrawn. Did not reach patient, asked her to call the office for non urgent results and to check her My Chart Message.

## 2021-05-24 ENCOUNTER — Ambulatory Visit: Payer: Medicaid Other | Admitting: Cardiology

## 2021-05-31 ENCOUNTER — Encounter: Payer: Self-pay | Admitting: Emergency Medicine

## 2021-06-09 ENCOUNTER — Other Ambulatory Visit: Payer: Self-pay

## 2021-06-09 ENCOUNTER — Ambulatory Visit: Payer: Medicaid Other | Attending: Obstetrics

## 2021-06-09 ENCOUNTER — Other Ambulatory Visit: Payer: Self-pay | Admitting: *Deleted

## 2021-06-09 ENCOUNTER — Ambulatory Visit: Payer: Medicaid Other | Admitting: *Deleted

## 2021-06-09 VITALS — BP 119/60 | HR 96

## 2021-06-09 DIAGNOSIS — Z3A24 24 weeks gestation of pregnancy: Secondary | ICD-10-CM | POA: Insufficient documentation

## 2021-06-09 DIAGNOSIS — O09292 Supervision of pregnancy with other poor reproductive or obstetric history, second trimester: Secondary | ICD-10-CM | POA: Diagnosis not present

## 2021-06-09 DIAGNOSIS — Z362 Encounter for other antenatal screening follow-up: Secondary | ICD-10-CM | POA: Insufficient documentation

## 2021-06-09 DIAGNOSIS — R638 Other symptoms and signs concerning food and fluid intake: Secondary | ICD-10-CM

## 2021-06-09 DIAGNOSIS — Z3492 Encounter for supervision of normal pregnancy, unspecified, second trimester: Secondary | ICD-10-CM

## 2021-06-09 DIAGNOSIS — O99892 Other specified diseases and conditions complicating childbirth: Secondary | ICD-10-CM

## 2021-06-09 DIAGNOSIS — O99212 Obesity complicating pregnancy, second trimester: Secondary | ICD-10-CM | POA: Insufficient documentation

## 2021-06-09 DIAGNOSIS — O352XX0 Maternal care for (suspected) hereditary disease in fetus, not applicable or unspecified: Secondary | ICD-10-CM | POA: Diagnosis not present

## 2021-06-09 DIAGNOSIS — O99322 Drug use complicating pregnancy, second trimester: Secondary | ICD-10-CM

## 2021-06-11 NOTE — L&D Delivery Note (Signed)
OB/GYN Faculty Practice Delivery Note ? ?Gail Robinson is a 31 y.o. [id number] s/p SVD at [redacted]w[redacted]d. She was admitted for SOL.  ? ?ROM: 0h 89m with light mec ?GBS Status:  Negative/-- (03/29 1404) ?Maximum Maternal Temperature: 98.18F ? ?Labor Progress: ?Initial SVE: Patient presented to MAU with complaints of contractions at around 1630.  She was found to be in active labor and admitted to L&D floor.  At around 2239 patient had AROM. She then progressed to complete.  ? ?Delivery Date/Time: 10/03/21 @ 2253 ?Delivery: Called to room and patient was complete and pushing. Head delivered LOA. No nuchal cord present. Shoulder and body delivered in usual fashion. Infant with spontaneous cry, placed on mother's abdomen, dried and stimulated. Cord clamped x 2 after 1-minute delay, and cut by friend of patient. Cord blood drawn. Placenta delivered spontaneously with gentle cord traction. Fundus firm with massage and Pitocin. Labia, perineum, vagina, and cervix inspected with sterile lap and found to have a hemostatic labial tear.  ?Baby Weight: pending ? ?Placenta: 3 vessel, intact. Sent to L&D ?Complications: None ?Lacerations: Hemostatic R labial tear ?EBL: 100 mL ?Analgesia: EpiduralYes  ? ?Infant:  ?APGAR (1 MIN): 9   ?APGAR (5 MINS): 9   ? ?Moshe Cipro, DO, PGY-1 ?10/03/2021, 11:30 PM ? ? ? ? ? ?

## 2021-06-13 ENCOUNTER — Encounter: Payer: Medicaid Other | Admitting: Student

## 2021-06-16 ENCOUNTER — Encounter: Payer: Self-pay | Admitting: Obstetrics and Gynecology

## 2021-06-16 ENCOUNTER — Other Ambulatory Visit: Payer: Self-pay

## 2021-06-16 ENCOUNTER — Ambulatory Visit (INDEPENDENT_AMBULATORY_CARE_PROVIDER_SITE_OTHER): Payer: Medicaid Other | Admitting: Obstetrics and Gynecology

## 2021-06-16 VITALS — BP 132/75 | HR 95 | Wt 230.3 lb

## 2021-06-16 DIAGNOSIS — Z3490 Encounter for supervision of normal pregnancy, unspecified, unspecified trimester: Secondary | ICD-10-CM

## 2021-06-16 DIAGNOSIS — O09299 Supervision of pregnancy with other poor reproductive or obstetric history, unspecified trimester: Secondary | ICD-10-CM

## 2021-06-16 NOTE — Progress Notes (Signed)
Needs refill on Rx albuterol inhaler

## 2021-06-16 NOTE — Patient Instructions (Signed)

## 2021-06-16 NOTE — Progress Notes (Signed)
Subjective:  Gail Robinson is a 31 y.o. [id number] at [redacted]w[redacted]d being seen today for ongoing prenatal care.  She is currently monitored for the following issues for this low-risk pregnancy and has History of fetal anomaly in prior pregnancy, currently pregnant; Major depressive disorder, recurrent episode (HCC); Bipolar I disorder, most recent episode depressed (HCC); Generalized anxiety disorder; and Supervision of low-risk pregnancy on their problem list.  Patient reports general discomforts of pregnancy.  Contractions: Irritability. Vag. Bleeding: None.  Movement: Present. Denies leaking of fluid.   The following portions of the patient's history were reviewed and updated as appropriate: allergies, current medications, past family history, past medical history, past social history, past surgical history and problem list. Problem list updated.  Objective:   Vitals:   06/16/21 0928  BP: 132/75  Pulse: 95  Weight: 230 lb 4.8 oz (104.5 kg)    Fetal Status: Fetal Heart Rate (bpm): 148   Movement: Present     General:  Alert, oriented and cooperative. Patient is in no acute distress.  Skin: Skin is warm and dry. No rash noted.   Cardiovascular: Normal heart rate noted  Respiratory: Normal respiratory effort, no problems with respiration noted  Abdomen: Soft, gravid, appropriate for gestational age. Pain/Pressure: Absent     Pelvic:  Cervical exam deferred        Extremities: Normal range of motion.  Edema: None  Mental Status: Normal mood and affect. Normal behavior. Normal judgment and thought content.   Urinalysis:      Assessment and Plan:  Pregnancy: [id number] at [redacted]w[redacted]d  1. Encounter for supervision of low-risk pregnancy, antepartum Stable Glucola next visit  2. History of fetal anomaly in prior pregnancy, currently pregnant F/U scan scheduled  Preterm labor symptoms and general obstetric precautions including but not limited to vaginal bleeding, contractions, leaking of fluid  and fetal movement were reviewed in detail with the patient. Please refer to After Visit Summary for other counseling recommendations.  Return in about 3 weeks (around 07/07/2021) for OB visit, face to face, any provider, fasting for Glucola.   Hermina Staggers, MD

## 2021-06-19 NOTE — BH Specialist Note (Signed)
Pt did not arrive to video visit and did not answer the phone; Left HIPPA-compliant message to call back Tawney Vanorman from [facility] at  [phone] (Quavis Klutz's office).  ?; left MyChart message for patient.  ? ?

## 2021-06-30 ENCOUNTER — Ambulatory Visit: Payer: Medicaid Other | Admitting: Clinical

## 2021-06-30 DIAGNOSIS — Z91199 Patient's noncompliance with other medical treatment and regimen due to unspecified reason: Secondary | ICD-10-CM

## 2021-07-07 ENCOUNTER — Other Ambulatory Visit: Payer: Self-pay

## 2021-07-07 ENCOUNTER — Other Ambulatory Visit: Payer: Medicaid Other

## 2021-07-07 ENCOUNTER — Encounter: Payer: Self-pay | Admitting: Medical

## 2021-07-07 ENCOUNTER — Ambulatory Visit (INDEPENDENT_AMBULATORY_CARE_PROVIDER_SITE_OTHER): Payer: Medicaid Other | Admitting: Medical

## 2021-07-07 VITALS — BP 121/82 | HR 108 | Wt 232.0 lb

## 2021-07-07 DIAGNOSIS — R102 Pelvic and perineal pain: Secondary | ICD-10-CM

## 2021-07-07 DIAGNOSIS — Z23 Encounter for immunization: Secondary | ICD-10-CM

## 2021-07-07 DIAGNOSIS — Z3490 Encounter for supervision of normal pregnancy, unspecified, unspecified trimester: Secondary | ICD-10-CM

## 2021-07-07 DIAGNOSIS — O26893 Other specified pregnancy related conditions, third trimester: Secondary | ICD-10-CM

## 2021-07-07 DIAGNOSIS — Z3A28 28 weeks gestation of pregnancy: Secondary | ICD-10-CM

## 2021-07-07 NOTE — Progress Notes (Signed)
° °  PRENATAL VISIT NOTE  Subjective:  Gail Robinson is a 31 y.o. [id number] at [redacted]w[redacted]d being seen today for ongoing prenatal care.  She is currently monitored for the following issues for this low-risk pregnancy and has History of fetal anomaly in prior pregnancy, currently pregnant; Major depressive disorder, recurrent episode (HCC); Bipolar I disorder, most recent episode depressed (HCC); Generalized anxiety disorder; and Supervision of low-risk pregnancy on their problem list.  Patient reports backache and pelvic pressure, decreased appetite.  Contractions: Not present. Vag. Bleeding: None.  Movement: Present. Denies leaking of fluid.   The following portions of the patient's history were reviewed and updated as appropriate: allergies, current medications, past family history, past medical history, past social history, past surgical history and problem list.   Objective:   Vitals:   07/07/21 0924  BP: 121/82  Pulse: (!) 108  Weight: 232 lb (105.2 kg)    Fetal Status: Fetal Heart Rate (bpm): 140   Movement: Present     General:  Alert, oriented and cooperative. Patient is in no acute distress.  Skin: Skin is warm and dry. No rash noted.   Cardiovascular: Normal heart rate noted  Respiratory: Normal respiratory effort, no problems with respiration noted  Abdomen: Soft, gravid, appropriate for gestational age.  Pain/Pressure: Present     Pelvic: Cervical exam deferred        Extremities: Normal range of motion.  Edema: Trace  Mental Status: Normal mood and affect. Normal behavior. Normal judgment and thought content.   Assessment and Plan:  Pregnancy: [id number] at [redacted]w[redacted]d 1. Encounter for supervision of low-risk pregnancy, antepartum - Tdap vaccine greater than or equal to 7yo IM - Unable to do fasting labs today, ate at 5 am today  - Planning POPs for [facility] - Follow-up US for growth due to BMI scheduled 08/03/21 - Patient states decreased appetite recently, discussed small frequent  snacks with high protein were ideal and could try protein shakes as well   2. [redacted] weeks gestation of pregnancy  3. Pelvic pain in pregnancy, antepartum, third trimester - Ambulatory referral to Physical Therapy - Belly Band given today  - Discussed hydrotherapy   Preterm labor symptoms and general obstetric precautions including but not limited to vaginal bleeding, contractions, leaking of fluid and fetal movement were reviewed in detail with the patient. Please refer to After Visit Summary for other counseling recommendations.   Return in about 2 weeks (around 07/21/2021) for LOB, In-Person.  Future Appointments  Date Time Provider Department Center  07/21/2021 10:15 AM Berle Mull [facility]  08/03/2021  9:30 AM [facility] NURSE [facility]  08/03/2021  9:45 AM [facility] US5 [facility]    Vonzella Nipple, PA-C

## 2021-07-08 LAB — CBC
Hematocrit: 38.7 % (ref 34.0–46.6)
Hemoglobin: 12.8 g/dL (ref 11.1–15.9)
MCH: 25.2 pg — ABNORMAL LOW (ref 26.6–33.0)
MCHC: 33.1 g/dL (ref 31.5–35.7)
MCV: 76 fL — ABNORMAL LOW (ref 79–97)
Platelets: 190 10*3/uL (ref 150–450)
RBC: 5.08 x10E6/uL (ref 3.77–5.28)
RDW: 12.3 % (ref 11.7–15.4)
WBC: 7.8 10*3/uL (ref 3.4–10.8)

## 2021-07-08 LAB — HIV ANTIBODY (ROUTINE TESTING W REFLEX): HIV Screen 4th Generation wRfx: NONREACTIVE

## 2021-07-08 LAB — GLUCOSE TOLERANCE, 2 HOURS W/ 1HR
Glucose, 1 hour: 84 mg/dL (ref 70–179)
Glucose, 2 hour: 94 mg/dL (ref 70–152)
Glucose, Fasting: 74 mg/dL (ref 70–91)

## 2021-07-08 LAB — RPR: RPR Ser Ql: NONREACTIVE

## 2021-07-21 ENCOUNTER — Other Ambulatory Visit: Payer: Self-pay

## 2021-07-21 ENCOUNTER — Ambulatory Visit (INDEPENDENT_AMBULATORY_CARE_PROVIDER_SITE_OTHER): Payer: Medicaid Other

## 2021-07-21 VITALS — BP 113/66 | HR 92 | Wt 232.0 lb

## 2021-07-21 DIAGNOSIS — R002 Palpitations: Secondary | ICD-10-CM

## 2021-07-21 DIAGNOSIS — O26893 Other specified pregnancy related conditions, third trimester: Secondary | ICD-10-CM

## 2021-07-21 DIAGNOSIS — Z3490 Encounter for supervision of normal pregnancy, unspecified, unspecified trimester: Secondary | ICD-10-CM

## 2021-07-21 DIAGNOSIS — R12 Heartburn: Secondary | ICD-10-CM

## 2021-07-21 DIAGNOSIS — Z3A30 30 weeks gestation of pregnancy: Secondary | ICD-10-CM

## 2021-07-21 DIAGNOSIS — R42 Dizziness and giddiness: Secondary | ICD-10-CM

## 2021-07-21 MED ORDER — FAMOTIDINE 20 MG PO TABS: 20.0000 mg | ORAL_TABLET | Freq: Two times a day (BID) | ORAL | 2 refills | Status: DC

## 2021-07-21 NOTE — Progress Notes (Signed)
° °  PRENATAL VISIT NOTE  Subjective:  Gail Robinson is a 31 y.o. [id number] at 104w0d being seen today for ongoing prenatal care.  She is currently monitored for the following issues for this low-risk pregnancy and has History of fetal anomaly in prior pregnancy, currently pregnant; Major depressive disorder, recurrent episode ([place]); Bipolar I disorder, most recent episode depressed ([place]); Generalized anxiety disorder; and Supervision of low-risk pregnancy on their problem list.  Patient reports several episodes of feeling lightheaded/dizzy. Reports when episodes happen she feels like she "can't catch my breath" or "like I'm going to black out". She reports this happened previously and was suppose to see cardiology but missed appointment and never rescheduled. She reports that she drinks about 10-15 bottles of water/day, but often eats a small breakfast and nothing else until dinner. Reports appetite is low or when she does eat has heartburn which makes her not want to eat.  Contractions: Not present. Vag. Bleeding: None.  Movement: Present. Denies leaking of fluid.   The following portions of the patient's history were reviewed and updated as appropriate: allergies, current medications, past family history, past medical history, past social history, past surgical history and problem list.   Objective:   Vitals:   07/21/21 1028  BP: 113/66  Pulse: 92  Weight: 232 lb (105.2 kg)    Fetal Status: Fetal Heart Rate (bpm): 133   Movement: Present     General:  Alert, oriented and cooperative. Patient is in no acute distress.  Skin: Skin is warm and dry. No rash noted.   Cardiovascular: Normal heart rate noted  Respiratory: Normal respiratory effort, no problems with respiration noted  Abdomen: Soft, gravid, appropriate for gestational age.  Pain/Pressure: Absent     Pelvic: Cervical exam deferred        Extremities: Normal range of motion.     Mental Status: Normal mood and affect. Normal  behavior. Normal judgment and thought content.   Assessment and Plan:  Pregnancy: [id number] at [redacted]w[redacted]d 1. Palpitations - Patient to reschedule cardio appointment - AMB Referral to [facility]  2. Heartburn during pregnancy in third trimester  - famotidine (PEPCID) 20 MG tablet; Take 1 tablet (20 mg total) by mouth 2 (two) times daily.  Dispense: 60 tablet; Refill: 2  3. Encounter for supervision of low-risk pregnancy, antepartum - Routine OB - Anticipatory guidance for upcoming appointments provided  4. [redacted] weeks gestation of pregnancy   5. Lightheaded - Recommend patient eat small, frequent meals instead of focusing on large meals d/t appetite and heartburn. Continue PO hydration. Patient will reschedule appointment with cardiology  Preterm labor symptoms and general obstetric precautions including but not limited to vaginal bleeding, contractions, leaking of fluid and fetal movement were reviewed in detail with the patient. Please refer to After Visit Summary for other counseling recommendations.   Return in about 2 weeks (around 08/04/2021).  Future Appointments  Date Time Provider Economy  07/28/2021  3:20 PM Freada Bergeron, MD [facility] None  08/03/2021  9:30 AM [facility] NURSE [facility]  08/03/2021  9:45 AM [facility] US5 [facility]  08/04/2021 10:15 AM Junie Panning, PT [facility] None  08/07/2021 11:15 AM Chancy Milroy, MD [facility]    Renee Harder, CNM

## 2021-07-21 NOTE — Progress Notes (Signed)
Patient reports occasional pain in lower abdomen. [name] also complains of feeling "lighthead everyday"

## 2021-07-26 NOTE — Progress Notes (Signed)
[facility]  New Evaluation  Date:  07/28/2021   ID:  Gail Robinson, DOB 21-Mar-1991, MRN [id number]  PCP:  Patient, No Pcp Per (Inactive)   [facility] Providers Cardiologist:  None  Electrophysiologist:  None   {   Referring MD: Renee Harder, CNM   Chief Complaint: Lightheadedness  History of Present Illness:    Gail Robinson is a 31 y.o. female [G5P2022] who is being seen today for the evaluation of lightheadedness and dizziness at the request of Renee Harder, CNM.   The patient was seen by Maryagnes Amos for episodes of lightheadedness with associated SOB. States that she is staying hydrated but appetite was poor due to heartburn. Given symptoms, she was referred to Cardiology for further management.  Today, the patient states that she has been having episodes of lightheadedness about once per week. All episodes occur while she is standing. Has some associated shortness of breath, palpitations, and feelings of being flushed. No LE edema, syncope, chest pain. Has been trying to remain hydrated. No similar symptoms during prior pregnancies.   Also with episode of heart palpitations that can occur for about a minute and then resolves.    Prior CV Studies Reviewed: The following studies were reviewed today: No prior imaging  Past Medical History:  Diagnosis Date   Asthma    childhood   Heartburn    Lightheaded    Palpitations     Past Surgical History:  Procedure Laterality Date   NO PAST SURGERIES        OB History     Gravida  5   Para  2   Term  2   Preterm      AB  2   Living  2      SAB      IAB  2   Ectopic      Multiple  0   Live Births  2               Current Medications: Current Meds  Medication Sig   albuterol (PROVENTIL HFA;VENTOLIN HFA) 108 (90 BASE) MCG/ACT inhaler Inhale 1-2 puffs into the lungs every 6 (six) hours as needed for wheezing or shortness of breath.    ARIPiprazole (ABILIFY) 5 MG tablet Take 1 tablet (5 mg total) by mouth daily.   Blood Pressure Monitoring (BLOOD PRESSURE KIT) DEVI 1 Device by Does not apply route as needed.   famotidine (PEPCID) 20 MG tablet Take 1 tablet (20 mg total) by mouth 2 (two) times daily.   hydrOXYzine (ATARAX/VISTARIL) 10 MG tablet TAKE 1 TABLET(10 MG) BY MOUTH THREE TIMES DAILY AS NEEDED   prenatal vitamin w/FE, FA (PRENATAL 1 + 1) 27-1 MG TABS tablet Take 1 tablet by mouth daily at 12 noon.     Allergies:   Patient has no known allergies.   Social History   Socioeconomic History   Marital status: Single    Spouse name: Not on file   Number of children: Not on file   Years of education: Not on file   Highest education level: Not on file  Occupational History   Not on file  Tobacco Use   Smoking status: Former    Packs/day: 0.30    Types: Cigarettes    Quit date: 12/23/2013    Years since quitting: 7.6   Smokeless tobacco: Never  Vaping Use   Vaping Use: Never used  Substance and Sexual Activity   Alcohol use: Not Currently  Comment: 1 pint to 2 pints alcohol per week.    Drug use: Yes    Types: Marijuana    Comment: NOT WHILE PREG   Sexual activity: Yes    Birth control/protection: Pill  Other Topics Concern   Not on file  Social History Narrative   Not on file   Social Determinants of Health   Financial Resource Strain: Not on file  Food Insecurity: Not on file  Transportation Needs: Not on file  Physical Activity: Not on file  Stress: Not on file  Social Connections: Not on file      Family History  Problem Relation Age of Onset   Healthy Mother    Healthy Father       ROS:   Please see the history of present illness.    Review of Systems  Respiratory:  Positive for shortness of breath.   Cardiovascular:  Positive for palpitations. Negative for chest pain, orthopnea, claudication, leg swelling and PND.  Neurological:  Positive for dizziness. Negative for loss of  consciousness.     Labs/EKG Reviewed:    EKG:   EKG is  ordered today.  The ekg ordered today demonstrates NSR HR 100  Recent Labs: 02/03/2021: ALT 14; BUN 9; Creatinine, Ser 0.78; Potassium 4.0; Sodium 135 07/07/2021: Hemoglobin 12.8; Platelets 190   Recent Lipid Panel Lab Results  Component Value Date/Time   CHOL 173 01/05/2020 11:53 AM   TRIG 63 01/05/2020 11:53 AM   HDL 73 01/05/2020 11:53 AM   CHOLHDL 2.4 01/05/2020 11:53 AM   LDLCALC 88 01/05/2020 11:53 AM    Physical Exam:    VS:  BP 122/80    Pulse (!) 184    Ht $R'5\' 6"'yB$  (1.676 m)    Wt 233 lb 12.8 oz (106.1 kg)    LMP 12/06/2020 (Approximate)    BMI 37.74 kg/m     Wt Readings from Last 3 Encounters:  07/28/21 233 lb 12.8 oz (106.1 kg)  07/21/21 232 lb (105.2 kg)  07/07/21 232 lb (105.2 kg)     GEN:  Well nourished, well developed in no acute distress HEENT: Normal NECK: No JVD; No carotid bruits CARDIAC: RRR, no murmurs, rubs, gallops RESPIRATORY:  Clear to auscultation without rales, wheezing or rhonchi  ABDOMEN: Soft, non-tender, non-distended MUSCULOSKELETAL:  No edema; No deformity  SKIN: Warm and dry NEUROLOGIC:  Alert and oriented x 3 PSYCHIATRIC:  Normal affect    Risk Assessment/Risk Calculators:    CARPREG II Risk Prediction Index Score:  1.  The patient's risk for a primary cardiac event is 5%. {         ASSESSMENT & PLAN:    #Lightheadedness: #Palpitations Patient with episodes of lightheadedness that are likely orthostatic in nature. Specifically, symptoms mainly occur after prolonged standing usually while cooking over a hot stove. Improve with sitting down. Likely exacerbated by venous stasis and suspected venous incompetence given prior pregnancies. Will check zio to ensure no arrhythmias given episodes of palpitations as well. Discussed compression sock therapy, importance of hydration and electrolyte repletion, and small frequent meals. If she feels faint, she needs to lay down and  elevate her legs.  -Check zio -Increase hydration -Small, frequent meals -Compression socks   Patient Instructions  Medication Instructions:   Your physician recommends that you continue on your current medications as directed. Please refer to the Current Medication list given to you today.  *If you need a refill on your cardiac medications before your next appointment, please call  your pharmacy*   Testing/Procedures:  [facility]ulling- Long Term Monitor Instructions  Your physician has requested you wear a ZIO patch monitor for 7 days.  This is a single patch monitor. [employer] supplies one patch monitor per enrollment. Additional stickers are not available. Please do not apply patch if you will be having a Nuclear Stress Test,  Echocardiogram, Cardiac CT, MRI, or Chest Xray during the period you would be wearing the  monitor. The patch cannot be worn during these tests. You cannot remove and re-apply the  ZIO XT patch monitor.  Your ZIO patch monitor will be mailed 3 day USPS to your address on file. It may take 3-5 days  to receive your monitor after you have been enrolled.  Once you have received your monitor, please review the enclosed instructions. Your monitor  has already been registered assigning a specific monitor serial # to you.  Billing and Patient Assistance Program Information  We have supplied [employer] with any of your insurance information on file for billing purposes. [employer] offers a sliding scale Patient Assistance Program for patients that do not have  insurance, or whose insurance does not completely cover the cost of the ZIO monitor.  You must apply for the Patient Assistance Program to qualify for this discounted rate.  To apply, please call [employer] at [phone], select option 4, select option 2, ask to apply for  Patient Assistance Program. Theodore Demark will ask your household income, and how many people  are in your household. They will quote your out-of-pocket cost  based on that information.  [employer] will also be able to set up a 53-month, interest-free payment plan if needed.  Applying the monitor   Shave hair from upper left chest.  Hold abrader disc by orange tab. Rub abrader in 40 strokes over the upper left chest as  indicated in your monitor instructions.  Clean area with 4 enclosed alcohol pads. Let dry.  Apply patch as indicated in monitor instructions. Patch will be placed under collarbone on left  side of chest with arrow pointing upward.  Rub patch adhesive wings for 2 minutes. Remove white label marked "1". Remove the white  label marked "2". Rub patch adhesive wings for 2 additional minutes.  While looking in a mirror, press and release button in center of patch. A small green light will  flash 3-4 times. This will be your only indicator that the monitor has been turned on.  Do not shower for the first 24 hours. You may shower after the first 24 hours.  Press the button if you feel a symptom. You will hear a small click. Record Date, Time and  Symptom in the Patient Logbook.  When you are ready to remove the patch, follow instructions on the last 2 pages of Patient  Logbook. Stick patch monitor onto the last page of Patient Logbook.  Place Patient Logbook in the blue and white box. Use locking tab on box and tape box closed  securely. The blue and white box has prepaid postage on it. Please place it in the mailbox as  soon as possible. Your physician should have your test results approximately 7 days after the  monitor has been mailed back to [facility].  Call [facility] at [phone] if you have questions regarding  your ZIO XT patch monitor. Call them immediately if you see an orange light blinking on your  monitor.  If your monitor falls off in less than 4 days, contact our Monitor  department at [phone].  If your monitor becomes loose or falls off after 4 days call [employer] at [phone] for   suggestions on securing your monitor   Follow-Up:  AS NEEDED WITH DR. Johney Frame HERE AT [place] 1}   Other Instructions  Orthostatic Hypotension Blood pressure is a measurement of how strongly, or weakly, your circulating blood is pressing against the walls of your arteries. Orthostatic hypotension is a drop in blood pressure that can happen when you change positions, such as when you go from lying down to standing. Arteries are blood vessels that carry blood from your heart throughout your body. When blood pressure is too low, you may not get enough blood to your brain or to the rest of your organs. Orthostatic hypotension can cause light-headedness, sweating, rapid heartbeat, blurred vision, and fainting. These symptoms require further investigation into the cause. What are the causes? Orthostatic hypotension can be caused by many things, including: Sudden changes in posture, such as standing up quickly after you have been sitting or lying down. Loss of blood (anemia) or loss of body fluids (dehydration). Heart problems, neurologic problems, or hormone problems. Pregnancy. Aging. The risk for this condition increases as you get older. Severe infection (sepsis). Certain medicines, such as medicines for high blood pressure or medicines that make the body lose excess fluids (diuretics). What are the signs or symptoms? Symptoms of this condition may include: Weakness, light-headedness, or dizziness. Sweating. Blurred vision. Tiredness (fatigue). Rapid heartbeat. Fainting, in severe cases. How is this diagnosed? This condition is diagnosed based on: Your symptoms and medical history. Your blood pressure measurements. Your health care provider will check your blood pressure when you are: Lying down. Sitting. Standing. A blood pressure reading is recorded as two numbers, such as "120 over 80" (or 120/80). The first ("top") number is called the systolic pressure. It is a  measure of the pressure in your arteries as your heart beats. The second ("bottom") number is called the diastolic pressure. It is a measure of the pressure in your arteries when your heart relaxes between beats. Blood pressure is measured in a unit called mmHg. Healthy blood pressure for most adults is 120/80 mmHg. Orthostatic hypotension is defined as a 20 mmHg drop in systolic pressure or a 10 mmHg drop in diastolic pressure within 3 minutes of standing. Other information or tests that may be used to diagnose orthostatic hypotension include: Your other vital signs, such as your heart rate and temperature. Blood tests. An electrocardiogram (ECG) or echocardiogram. A Holter monitor. This is a device you wear that records your heart rhythm continuously, usually for 24-48 hours. Tilt table test. For this test, you will be safely secured to a table that moves you from a lying position to an upright position. Your heart rhythm and blood pressure will be monitored during the test. How is this treated? This condition may be treated by: Changing your diet. This may involve eating more salt (sodium) or drinking more water. Changing the dosage of certain medicines you are taking that might be lowering your blood pressure. Correcting the underlying reason for the orthostatic hypotension. Wearing compression stockings. Taking medicines to raise your blood pressure. Avoiding actions that trigger symptoms. Follow these instructions at home: Medicines Take over-the-counter and prescription medicines only as told by your health care provider. Follow instructions from your health care provider about changing the dosage of your current medicines, if this applies. Do not stop or adjust any of your medicines on your own. Eating  and drinking Illustration of a person drinking a glass of water.  Drink enough fluid to keep your urine pale yellow. Eat extra salt only as directed. Do not add extra salt to your diet  unless advised by your health care provider. Eat frequent, small meals. Avoid standing up suddenly after eating. General instructions Compression stockings on a person's lower legs.  Get up slowly from lying down or sitting positions. This gives your blood pressure a chance to adjust. Avoid hot showers and excessive heat as directed by your health care provider. Engage in regular physical activity as directed by your health care provider. If you have compression stockings, wear them as told. Keep all follow-up visits. This is important. Contact a health care provider if: You have a fever for more than 2-3 days. You feel more thirsty than usual. You feel dizzy or weak. Get help right away if: You have chest pain. You have a fast or irregular heartbeat. You become sweaty or feel light-headed. You feel short of breath. You faint. You have any symptoms of a stroke. "BE FAST" is an easy way to remember the main warning signs of a stroke: B - Balance. Signs are dizziness, sudden trouble walking, or loss of balance. E - Eyes. Signs are trouble seeing or a sudden change in vision. F - Face. Signs are sudden weakness or numbness of the face, or the face or eyelid drooping on one side. A - Arms. Signs are weakness or numbness in an arm. This happens suddenly and usually on one side of the body. S - Speech. Signs are sudden trouble speaking, slurred speech, or trouble understanding what people say. T - Time. Time to call emergency services. Write down what time symptoms started. You have other signs of a stroke, such as: A sudden, severe headache with no known cause. Nausea or vomiting. Seizure. These symptoms may represent a serious problem that is an emergency. Do not wait to see if the symptoms will go away. Get medical help right away. Call your local emergency services (911 in the [place] Do not drive yourself to the hospital. Summary Orthostatic hypotension is a sudden drop in blood  pressure. It can cause light-headedness, sweating, rapid heartbeat, blurred vision, and fainting. Orthostatic hypotension can be diagnosed by having your blood pressure taken while lying down, sitting, and then standing. Treatment may involve changing your diet, wearing compression stockings, sitting up slowly, adjusting your medicines, or correcting the underlying reason for the orthostatic hypotension. Get help right away if you have chest pain, a fast or irregular heartbeat, or symptoms of a stroke. This information is not intended to replace advice given to you by your health care provider. Make sure you discuss any questions you have with your health care provider. Document Revised: 08/11/2020 Document Reviewed: 08/11/2020 Elsevier Patient Education  [place]:  No follow-ups on file.   Medication Adjustments/Labs and Tests Ordered: Current medicines are reviewed at length with the patient today.  Concerns regarding medicines are outlined above.  Tests Ordered: Orders Placed This Encounter  Procedures   LONG TERM MONITOR (3-14 DAYS)   EKG 12-Lead   Medication Changes: No orders of the defined types were placed in this encounter.

## 2021-07-28 ENCOUNTER — Encounter: Payer: Self-pay | Admitting: Cardiology

## 2021-07-28 ENCOUNTER — Ambulatory Visit (INDEPENDENT_AMBULATORY_CARE_PROVIDER_SITE_OTHER): Payer: Medicaid Other | Admitting: Cardiology

## 2021-07-28 ENCOUNTER — Ambulatory Visit: Payer: Medicaid Other

## 2021-07-28 ENCOUNTER — Other Ambulatory Visit: Payer: Self-pay

## 2021-07-28 VITALS — BP 122/80 | HR 184 | Ht 66.0 in | Wt 233.8 lb

## 2021-07-28 DIAGNOSIS — R002 Palpitations: Secondary | ICD-10-CM

## 2021-07-28 DIAGNOSIS — R42 Dizziness and giddiness: Secondary | ICD-10-CM | POA: Diagnosis not present

## 2021-07-28 NOTE — Patient Instructions (Addendum)
Medication Instructions:  ° °Your physician recommends that you continue on your current medications as directed. Please refer to the Current Medication list given to you today. ° °*If you need a refill on your cardiac medications before your next appointment, please call your pharmacy* ° ° °Testing/Procedures: ° °ZIO XT- Long Term Monitor Instructions ° °Your physician has requested you wear a ZIO patch monitor for 7 days.  °This is a single patch monitor. [employer] supplies one patch monitor per enrollment. Additional °stickers are not available. Please do not apply patch if you will be having a Nuclear Stress Test,  °Echocardiogram, Cardiac CT, MRI, or Chest Xray during the period you would be wearing the  °monitor. The patch cannot be worn during these tests. You cannot remove and re-apply the  °ZIO XT patch monitor.  °Your ZIO patch monitor will be mailed 3 day USPS to your address on file. It may take 3-5 days  °to receive your monitor after you have been enrolled.  °Once you have received your monitor, please review the enclosed instructions. Your monitor  °has already been registered assigning a specific monitor serial # to you. ° °Billing and Patient Assistance Program Information ° °We have supplied [employer] with any of your insurance information on file for billing purposes. °[employer] offers a sliding scale Patient Assistance Program for patients that do not have  °insurance, or whose insurance does not completely cover the cost of the ZIO monitor.  °You must apply for the Patient Assistance Program to qualify for this discounted rate.  °To apply, please call [employer] at [phone], select option 4, select option 2, ask to apply for  °Patient Assistance Program. [employer] will ask your household income, and how many people  °are in your household. They will quote your out-of-pocket cost based on that information.  °[employer] will also be able to set up a 12-month, interest-free payment plan if  needed. ° °Applying the monitor °  °Shave hair from upper left chest.  °Hold abrader disc by orange tab. Rub abrader in 40 strokes over the upper left chest as  °indicated in your monitor instructions.  °Clean area with 4 enclosed alcohol pads. Let dry.  °Apply patch as indicated in monitor instructions. Patch will be placed under collarbone on left  °side of chest with arrow pointing upward.  °Rub patch adhesive wings for 2 minutes. Remove white label marked "1". Remove the white  °label marked "2". Rub patch adhesive wings for 2 additional minutes.  °While looking in a mirror, press and release button in center of patch. A small green light will  °flash 3-4 times. This will be your only indicator that the monitor has been turned on.  °Do not shower for the first 24 hours. You may shower after the first 24 hours.  °Press the button if you feel a symptom. You will hear a small click. Record Date, Time and  °Symptom in the Patient Logbook.  °When you are ready to remove the patch, follow instructions on the last 2 pages of Patient  °Logbook. Stick patch monitor onto the last page of Patient Logbook.  °Place Patient Logbook in the blue and white box. Use locking tab on box and tape box closed  °securely. The blue and white box has prepaid postage on it. Please place it in the mailbox as  °soon as possible. Your physician should have your test results approximately 7 days after the  °monitor has been mailed back to [employer].  °Call [employer]   Customer Care at [phone] if you have questions regarding  your ZIO XT patch monitor. Call them immediately if you see an orange light blinking on your  monitor.  If your monitor falls off in less than 4 days, contact our Monitor department at [phone].  If your monitor becomes loose or falls off after 4 days call [employer] at [phone] for  suggestions on securing your monitor   Follow-Up:  AS NEEDED WITH DR. Shari Prows HERE AT [facility]}    Other Instructions  Orthostatic Hypotension Blood pressure is a measurement of how strongly, or weakly, your circulating blood is pressing against the walls of your arteries. Orthostatic hypotension is a drop in blood pressure that can happen when you change positions, such as when you go from lying down to standing. Arteries are blood vessels that carry blood from your heart throughout your body. When blood pressure is too low, you may not get enough blood to your brain or to the rest of your organs. Orthostatic hypotension can cause light-headedness, sweating, rapid heartbeat, blurred vision, and fainting. These symptoms require further investigation into the cause. What are the causes? Orthostatic hypotension can be caused by many things, including: Sudden changes in posture, such as standing up quickly after you have been sitting or lying down. Loss of blood (anemia) or loss of body fluids (dehydration). Heart problems, neurologic problems, or hormone problems. Pregnancy. Aging. The risk for this condition increases as you get older. Severe infection (sepsis). Certain medicines, such as medicines for high blood pressure or medicines that make the body lose excess fluids (diuretics). What are the signs or symptoms? Symptoms of this condition may include: Weakness, light-headedness, or dizziness. Sweating. Blurred vision. Tiredness (fatigue). Rapid heartbeat. Fainting, in severe cases. How is this diagnosed? This condition is diagnosed based on: Your symptoms and medical history. Your blood pressure measurements. Your health care provider will check your blood pressure when you are: Lying down. Sitting. Standing. A blood pressure reading is recorded as two numbers, such as "120 over 80" (or 120/80). The first ("top") number is called the systolic pressure. It is a measure of the pressure in your arteries as your heart beats. The second ("bottom") number is called the diastolic  pressure. It is a measure of the pressure in your arteries when your heart relaxes between beats. Blood pressure is measured in a unit called mmHg. Healthy blood pressure for most adults is 120/80 mmHg. Orthostatic hypotension is defined as a 20 mmHg drop in systolic pressure or a 10 mmHg drop in diastolic pressure within 3 minutes of standing. Other information or tests that may be used to diagnose orthostatic hypotension include: Your other vital signs, such as your heart rate and temperature. Blood tests. An electrocardiogram (ECG) or echocardiogram. A Holter monitor. This is a device you wear that records your heart rhythm continuously, usually for 24-48 hours. Tilt table test. For this test, you will be safely secured to a table that moves you from a lying position to an upright position. Your heart rhythm and blood pressure will be monitored during the test. How is this treated? This condition may be treated by: Changing your diet. This may involve eating more salt (sodium) or drinking more water. Changing the dosage of certain medicines you are taking that might be lowering your blood pressure. Correcting the underlying reason for the orthostatic hypotension. Wearing compression stockings. Taking medicines to raise your blood pressure. Avoiding actions that trigger symptoms. Follow these instructions at home: Medicines Take  over-the-counter and prescription medicines only as told by your health care provider. Follow instructions from your health care provider about changing the dosage of your current medicines, if this applies. Do not stop or adjust any of your medicines on your own. Eating and drinking Illustration of a person drinking a glass of water.  Drink enough fluid to keep your urine pale yellow. Eat extra salt only as directed. Do not add extra salt to your diet unless advised by your health care provider. Eat frequent, small meals. Avoid standing up suddenly after  eating. General instructions Compression stockings on a person's lower legs.  Get up slowly from lying down or sitting positions. This gives your blood pressure a chance to adjust. Avoid hot showers and excessive heat as directed by your health care provider. Engage in regular physical activity as directed by your health care provider. If you have compression stockings, wear them as told. Keep all follow-up visits. This is important. Contact a health care provider if: You have a fever for more than 2-3 days. You feel more thirsty than usual. You feel dizzy or weak. Get help right away if: You have chest pain. You have a fast or irregular heartbeat. You become sweaty or feel light-headed. You feel short of breath. You faint. You have any symptoms of a stroke. "BE FAST" is an easy way to remember the main warning signs of a stroke: B - Balance. Signs are dizziness, sudden trouble walking, or loss of balance. E - Eyes. Signs are trouble seeing or a sudden change in vision. F - Face. Signs are sudden weakness or numbness of the face, or the face or eyelid drooping on one side. A - Arms. Signs are weakness or numbness in an arm. This happens suddenly and usually on one side of the body. S - Speech. Signs are sudden trouble speaking, slurred speech, or trouble understanding what people say. T - Time. Time to call emergency services. Write down what time symptoms started. You have other signs of a stroke, such as: A sudden, severe headache with no known cause. Nausea or vomiting. Seizure. These symptoms may represent a serious problem that is an emergency. Do not wait to see if the symptoms will go away. Get medical help right away. Call your local emergency services (911 in the [place] Do not drive yourself to the hospital. Summary Orthostatic hypotension is a sudden drop in blood pressure. It can cause light-headedness, sweating, rapid heartbeat, blurred vision, and fainting. Orthostatic  hypotension can be diagnosed by having your blood pressure taken while lying down, sitting, and then standing. Treatment may involve changing your diet, wearing compression stockings, sitting up slowly, adjusting your medicines, or correcting the underlying reason for the orthostatic hypotension. Get help right away if you have chest pain, a fast or irregular heartbeat, or symptoms of a stroke. This information is not intended to replace advice given to you by your health care provider. Make sure you discuss any questions you have with your health care provider. Document Revised: 08/11/2020 Document Reviewed: 08/11/2020 Elsevier Patient Education  2022 [employer].

## 2021-07-28 NOTE — Progress Notes (Unsigned)
Enrolled pt for 7 day Zio XT to be mailed to home address. °

## 2021-08-03 ENCOUNTER — Ambulatory Visit: Payer: Medicaid Other | Admitting: *Deleted

## 2021-08-03 ENCOUNTER — Other Ambulatory Visit: Payer: Self-pay

## 2021-08-03 ENCOUNTER — Ambulatory Visit: Payer: Medicaid Other | Attending: Obstetrics and Gynecology

## 2021-08-03 VITALS — BP 139/71 | HR 92

## 2021-08-03 DIAGNOSIS — Z3A31 31 weeks gestation of pregnancy: Secondary | ICD-10-CM

## 2021-08-03 DIAGNOSIS — Z3689 Encounter for other specified antenatal screening: Secondary | ICD-10-CM | POA: Diagnosis present

## 2021-08-03 DIAGNOSIS — O99212 Obesity complicating pregnancy, second trimester: Secondary | ICD-10-CM | POA: Diagnosis present

## 2021-08-03 DIAGNOSIS — Z362 Encounter for other antenatal screening follow-up: Secondary | ICD-10-CM

## 2021-08-03 DIAGNOSIS — O352XX0 Maternal care for (suspected) hereditary disease in fetus, not applicable or unspecified: Secondary | ICD-10-CM

## 2021-08-04 ENCOUNTER — Ambulatory Visit: Payer: Medicaid Other | Attending: Medical | Admitting: Physical Therapy

## 2021-08-06 ENCOUNTER — Other Ambulatory Visit: Payer: Self-pay

## 2021-08-06 ENCOUNTER — Encounter (HOSPITAL_COMMUNITY): Payer: Self-pay | Admitting: Obstetrics & Gynecology

## 2021-08-06 ENCOUNTER — Inpatient Hospital Stay (HOSPITAL_COMMUNITY)
Admission: AD | Admit: 2021-08-06 | Discharge: 2021-08-06 | Disposition: A | Discharge status: Home / Self Care | Payer: Medicaid Other | Attending: Obstetrics & Gynecology | Admitting: Obstetrics & Gynecology

## 2021-08-06 DIAGNOSIS — Z3689 Encounter for other specified antenatal screening: Secondary | ICD-10-CM | POA: Diagnosis not present

## 2021-08-06 DIAGNOSIS — O368130 Decreased fetal movements, third trimester, not applicable or unspecified: Secondary | ICD-10-CM | POA: Diagnosis present

## 2021-08-06 DIAGNOSIS — Z3A32 32 weeks gestation of pregnancy: Secondary | ICD-10-CM | POA: Diagnosis not present

## 2021-08-06 NOTE — MAU Provider Note (Signed)
History     CSN: [id number]  Arrival date and time: 08/06/21 0945   Event Date/Time   First Provider Initiated Contact with Patient 08/06/21 1010      Chief Complaint  Patient presents with   Abdominal Pain   Decreased Fetal Movement   30 y.op. [id number] $[id number]$ .2 wks presenting with decreased FM. Reports less FM x2 days and no FM felt since yesterday am. She reports usually feeling movement in the evenings, not as much during the day. She is eating and drinking well and tried soda and coffee to increase movement. No other complaints.   OB History     Gravida  5   Para  2   Term  2   Preterm      AB  2   Living  2      SAB      IAB  2   Ectopic      Multiple  0   Live Births  2           Past Medical History:  Diagnosis Date   Asthma    childhood   Heartburn    Lightheaded    Palpitations     Past Surgical History:  Procedure Laterality Date   NO PAST SURGERIES      Family History  Problem Relation Age of Onset   Healthy Mother    Healthy Father     Social History   Tobacco Use   Smoking status: Former    Packs/day: 0.30    Types: Cigarettes    Quit date: 12/23/2013    Years since quitting: 7.6   Smokeless tobacco: Never  Vaping Use   Vaping Use: Never used  Substance Use Topics   Alcohol use: Not Currently    Comment: 1 pint to 2 pints alcohol per week.    Drug use: Yes    Types: Marijuana    Comment: NOT WHILE PREG    Allergies: No Known Allergies  Medications Prior to Admission  Medication Sig Dispense Refill Last Dose   albuterol (PROVENTIL HFA;VENTOLIN HFA) 108 (90 BASE) MCG/ACT inhaler Inhale 1-2 puffs into the lungs every 6 (six) hours as needed for wheezing or shortness of breath. 1 Inhaler 12    ARIPiprazole (ABILIFY) 5 MG tablet Take 1 tablet (5 mg total) by mouth daily. 30 tablet 2    Blood Pressure Monitoring (BLOOD PRESSURE KIT) DEVI 1 Device by Does not apply route as needed. 1 each 0    famotidine (PEPCID) 20 MG  tablet Take 1 tablet (20 mg total) by mouth 2 (two) times daily. 60 tablet 2    hydrOXYzine (ATARAX/VISTARIL) 10 MG tablet TAKE 1 TABLET(10 MG) BY MOUTH THREE TIMES DAILY AS NEEDED 90 tablet 2    prenatal vitamin w/FE, FA (PRENATAL 1 + 1) 27-1 MG TABS tablet Take 1 tablet by mouth daily at 12 noon. 30 tablet 11     Review of Systems  Gastrointestinal:  Negative for abdominal pain.  Genitourinary:  Negative for vaginal bleeding.  Physical Exam   Blood pressure 117/70, pulse (!) 110, temperature 98.3 F (36.8 C), temperature source Oral, resp. rate 17, last menstrual period 12/06/2020.  Physical Exam Constitutional:      General: She is not in acute distress.    Appearance: Normal appearance.  HENT:     Head: Normocephalic and atraumatic.  Cardiovascular:     Rate and Rhythm: Normal rate.  Pulmonary:     Effort: Pulmonary effort is  normal. No respiratory distress.  Abdominal:     Palpations: Abdomen is soft.     Tenderness: There is no abdominal tenderness.  Musculoskeletal:        General: Normal range of motion.     Cervical back: Normal range of motion.  Skin:    General: Skin is warm and dry.  Neurological:     General: No focal deficit present.     Mental Status: She is alert and oriented to person, place, and time.  Psychiatric:        Mood and Affect: Mood normal.        Behavior: Behavior normal.  EFM: 135 bpm, mod variability, + accels, no decels Toco: none  No results found for this or any previous visit (from the past 24 hour(s)).  MAU Course  Procedures  MDM NST reactive. Pt feeling normal FM since EFM applied and marked 10 times in first hour. Pt reassured and discussed FMCs. Stable for discharge home.  Assessment and Plan   1. [redacted] weeks gestation of pregnancy   2. NST (non-stress test) reactive    Discharge home Follow up at [facility] tomorrow as scheduled [facility]  Allergies as of 08/06/2021   No Known Allergies      Medication List     TAKE these  medications    albuterol 108 (90 Base) MCG/ACT inhaler Commonly known as: VENTOLIN HFA Inhale 1-2 puffs into the lungs every 6 (six) hours as needed for wheezing or shortness of breath.   ARIPiprazole 5 MG tablet Commonly known as: ABILIFY Take 1 tablet (5 mg total) by mouth daily.   Blood Pressure Kit Devi 1 Device by Does not apply route as needed.   famotidine 20 MG tablet Commonly known as: Pepcid Take 1 tablet (20 mg total) by mouth 2 (two) times daily.   hydrOXYzine 10 MG tablet Commonly known as: ATARAX TAKE 1 TABLET(10 MG) BY MOUTH THREE TIMES DAILY AS NEEDED   prenatal vitamin w/FE, FA 27-1 MG Tabs tablet Take 1 tablet by mouth daily at 12 noon.       Julianne Handler, CNM 08/06/2021, 11:30 AM

## 2021-08-06 NOTE — MAU Note (Signed)
Pt reports to mau with c/o DFM for the past 2 days.  States she felt one movement yesterday morning.  Reports she has tried drinking cold, sugary fluids, changing positions, and eating,but still has not felt baby move.  FHR 135,  Denies LOF but reports some ongoing cramping for the past week,  denies vag bleeding or dc

## 2021-08-07 ENCOUNTER — Encounter: Payer: Self-pay | Admitting: Obstetrics and Gynecology

## 2021-08-07 ENCOUNTER — Ambulatory Visit (INDEPENDENT_AMBULATORY_CARE_PROVIDER_SITE_OTHER): Payer: Medicaid Other | Admitting: Obstetrics and Gynecology

## 2021-08-07 VITALS — BP 116/72 | HR 112 | Wt 233.2 lb

## 2021-08-07 DIAGNOSIS — R002 Palpitations: Secondary | ICD-10-CM | POA: Insufficient documentation

## 2021-08-07 DIAGNOSIS — Z3493 Encounter for supervision of normal pregnancy, unspecified, third trimester: Secondary | ICD-10-CM

## 2021-08-07 NOTE — Progress Notes (Signed)
Subjective:  Gail Robinson is a 31 y.o. [id number] at [redacted]w[redacted]d being seen today for ongoing prenatal care.  She is currently monitored for the following issues for this low-risk pregnancy and has History of fetal anomaly in prior pregnancy, currently pregnant; Major depressive disorder, recurrent episode (HCC); Bipolar I disorder, most recent episode depressed (HCC); Generalized anxiety disorder; Supervision of low-risk pregnancy; and Heart palpitations on their problem list.  Patient reports no complaints.  Contractions: Irritability. Vag. Bleeding: None.  Movement: Present. Denies leaking of fluid.   The following portions of the patient's history were reviewed and updated as appropriate: allergies, current medications, past family history, past medical history, past social history, past surgical history and problem list. Problem list updated.  Objective:   Vitals:   08/07/21 1127  BP: 116/72  Pulse: (!) 112  Weight: 233 lb 3.2 oz (105.8 kg)    Fetal Status: Fetal Heart Rate (bpm): 144   Movement: Present     General:  Alert, oriented and cooperative. Patient is in no acute distress.  Skin: Skin is warm and dry. No rash noted.   Cardiovascular: Normal heart rate noted  Respiratory: Normal respiratory effort, no problems with respiration noted  Abdomen: Soft, gravid, appropriate for gestational age. Pain/Pressure: Present     Pelvic:  Cervical exam deferred        Extremities: Normal range of motion.  Edema: Trace  Mental Status: Normal mood and affect. Normal behavior. Normal judgment and thought content.   Urinalysis:      Assessment and Plan:  Pregnancy: [id number] at [redacted]w[redacted]d  1. Encounter for supervision of low-risk pregnancy in third trimester Stable Normal growth last U/S  2. Heart palpitations Has seen OB cards Evaluation in process  Preterm labor symptoms and general obstetric precautions including but not limited to vaginal bleeding, contractions, leaking of fluid and  fetal movement were reviewed in detail with the patient. Please refer to After Visit Summary for other counseling recommendations.  Return in about 2 weeks (around 08/21/2021) for OB visit, face to face, any provider.   Hermina Staggers, MD

## 2021-08-07 NOTE — Patient Instructions (Signed)

## 2021-08-24 ENCOUNTER — Ambulatory Visit (INDEPENDENT_AMBULATORY_CARE_PROVIDER_SITE_OTHER): Payer: Medicaid Other | Admitting: Certified Nurse Midwife

## 2021-08-24 ENCOUNTER — Other Ambulatory Visit: Payer: Self-pay

## 2021-08-24 VITALS — BP 140/76 | HR 90 | Wt 240.4 lb

## 2021-08-24 DIAGNOSIS — Z3A34 34 weeks gestation of pregnancy: Secondary | ICD-10-CM

## 2021-08-24 DIAGNOSIS — M545 Low back pain, unspecified: Secondary | ICD-10-CM

## 2021-08-24 DIAGNOSIS — Z3493 Encounter for supervision of normal pregnancy, unspecified, third trimester: Secondary | ICD-10-CM

## 2021-08-24 LAB — POCT URINALYSIS DIP (DEVICE)
Bilirubin Urine: NEGATIVE
Glucose, UA: NEGATIVE mg/dL
Hgb urine dipstick: NEGATIVE
Ketones, ur: NEGATIVE mg/dL
Leukocytes,Ua: NEGATIVE
Nitrite: NEGATIVE
Protein, ur: NEGATIVE mg/dL
Specific Gravity, Urine: 1.02 (ref 1.005–1.030)
Urobilinogen, UA: 0.2 mg/dL (ref 0.0–1.0)
pH: 7.5 (ref 5.0–8.0)

## 2021-08-24 MED ORDER — CYCLOBENZAPRINE HCL 10 MG PO TABS: 10.0000 mg | ORAL_TABLET | Freq: Three times a day (TID) | ORAL | 1 refills | Status: DC | PRN

## 2021-08-24 NOTE — Progress Notes (Signed)
Pt states still having a lot of back pain at night. ?

## 2021-08-27 LAB — URINE CULTURE, OB REFLEX

## 2021-08-27 LAB — CULTURE, OB URINE

## 2021-08-29 ENCOUNTER — Encounter: Payer: Self-pay | Admitting: Physical Therapy

## 2021-08-29 NOTE — Progress Notes (Signed)
? ?  PRENATAL VISIT NOTE ? ?Subjective:  ?Gail Robinson is a 31 y.o. [id number] at [redacted]w[redacted]d being seen today for ongoing prenatal care.  She is currently monitored for the following issues for this low-risk pregnancy and has History of fetal anomaly in prior pregnancy, currently pregnant; Major depressive disorder, recurrent episode (HCC); Bipolar I disorder, most recent episode depressed (HCC); Generalized anxiety disorder; Supervision of low-risk pregnancy; and Heart palpitations on their problem list. ? ?Patient reports  back pain at night, otherwise doing well .  Contractions: Irritability. Vag. Bleeding: None.  Movement: Present. Denies leaking of fluid.  ? ?The following portions of the patient's history were reviewed and updated as appropriate: allergies, current medications, past family history, past medical history, past social history, past surgical history and problem list.  ? ?Objective:  ? ?Vitals:  ? 08/24/21 1127  ?BP: 140/76  ?Pulse: 90  ?Weight: 240 lb 6.4 oz (109 kg)  ? ? ?Fetal Status: Fetal Heart Rate (bpm): 142 Fundal Height: 35 cm Movement: Present  Presentation: Vertex ? ?General:  Alert, oriented and cooperative. Patient is in no acute distress.  ?Skin: Skin is warm and dry. No rash noted.   ?Cardiovascular: Normal heart rate noted  ?Respiratory: Normal respiratory effort, no problems with respiration noted  ?Abdomen: Soft, gravid, appropriate for gestational age.  Pain/Pressure: Present     ?Pelvic: Cervical exam deferred        ?Extremities: Normal range of motion.  Edema: None  ?Mental Status: Normal mood and affect. Normal behavior. Normal judgment and thought content.  ? ?Assessment and Plan:  ?Pregnancy: [id number] at [redacted]w[redacted]d ?1. Supervision of low-risk pregnancy, third trimester ?- Doing well, feeling regular and vigorous fetal movement  ? ?2. [redacted] weeks gestation of pregnancy ?- Routine OB care including anticipatory guidance re GBS testing at next visit ? ?3. Acute bilateral low back pain  without sciatica ?- cyclobenzaprine (FLEXERIL) 10 MG tablet; Take 1 tablet (10 mg total) by mouth every 8 (eight) hours as needed for muscle spasms.  Dispense: 30 tablet; Refill: 1 ?- Culture, OB Urine ? ?Preterm labor symptoms and general obstetric precautions including but not limited to vaginal bleeding, contractions, leaking of fluid and fetal movement were reviewed in detail with the patient. ?Please refer to After Visit Summary for other counseling recommendations.  ? ?Return in about 2 weeks (around 09/07/2021) for IN-PERSON, LOB/GBS. ? ?Future Appointments  ?Date Time Provider Department Center  ?09/06/2021  9:35 AM Anyanwu, Jethro Bastos, MD [facility]  ? ? ?Bernerd Limbo, CNM ? ?

## 2021-09-06 ENCOUNTER — Other Ambulatory Visit (HOSPITAL_COMMUNITY)
Admission: RE | Admit: 2021-09-06 | Discharge: 2021-09-06 | Disposition: A | Discharge status: Home / Self Care | Payer: Medicaid Other | Source: Ambulatory Visit | Attending: Obstetrics & Gynecology | Admitting: Obstetrics & Gynecology

## 2021-09-06 ENCOUNTER — Encounter: Payer: Self-pay | Admitting: Obstetrics & Gynecology

## 2021-09-06 ENCOUNTER — Other Ambulatory Visit: Payer: Self-pay

## 2021-09-06 ENCOUNTER — Ambulatory Visit (INDEPENDENT_AMBULATORY_CARE_PROVIDER_SITE_OTHER): Payer: Medicaid Other | Admitting: Student

## 2021-09-06 VITALS — BP 121/75 | HR 105 | Wt 239.7 lb

## 2021-09-06 DIAGNOSIS — Z3A36 36 weeks gestation of pregnancy: Secondary | ICD-10-CM | POA: Diagnosis present

## 2021-09-06 DIAGNOSIS — Z3493 Encounter for supervision of normal pregnancy, unspecified, third trimester: Secondary | ICD-10-CM

## 2021-09-06 DIAGNOSIS — M545 Low back pain, unspecified: Secondary | ICD-10-CM

## 2021-09-06 DIAGNOSIS — R300 Dysuria: Secondary | ICD-10-CM

## 2021-09-06 DIAGNOSIS — O26893 Other specified pregnancy related conditions, third trimester: Secondary | ICD-10-CM

## 2021-09-06 LAB — POCT URINALYSIS DIP (DEVICE)
Bilirubin Urine: NEGATIVE
Glucose, UA: NEGATIVE mg/dL
Hgb urine dipstick: NEGATIVE
Ketones, ur: NEGATIVE mg/dL
Nitrite: NEGATIVE
Protein, ur: NEGATIVE mg/dL
Specific Gravity, Urine: 1.02 (ref 1.005–1.030)
Urobilinogen, UA: 0.2 mg/dL (ref 0.0–1.0)
pH: 7 (ref 5.0–8.0)

## 2021-09-06 MED ORDER — NITROFURANTOIN MONOHYD MACRO 100 MG PO CAPS
100.0000 mg | ORAL_CAPSULE | Freq: Two times a day (BID) | ORAL | 0 refills | Status: AC
Start: 2021-09-06 — End: 2021-09-09

## 2021-09-06 NOTE — Progress Notes (Signed)
? ?  PRENATAL VISIT NOTE ? ?Subjective:  ?Gail Robinson is a 31 y.o. [id number] at [redacted]w[redacted]d who presents today for routine prenatal care.  She is currently being monitored for supervision of a low-risk pregnancy with problems as listed below.  Patient has no pregnancy related concerns and endorses fetal movement.  She denies vaginal concerns including discharge, bleeding, leaking, itching, and burning.  ? ?Patient has noticed increased frequency of small voids and inability to empty bladder on first attempt over the past 1-2 months. Patient has noticed these symptoms worsening further along in the pregnancy. Patient reports waking up 8-10x/ every night to relieve her bladder. Patient is drinking ~ 10 bottles of water a day, and continues drinking fluids until bedtime.  Only other fluids are a Sprite 1-2x/day and an occasional cup of juice. Patient denies any odor, pain, burning, or noticeable blood in urine. ? ?Still having sciatic pain in right buttocks. Reports noticing improvement with use of Flexeril. ? ?Patient Active Problem List  ? Diagnosis Date Noted  ? Heart palpitations 08/07/2021  ? Supervision of low-risk pregnancy 04/21/2021  ? Bipolar I disorder, most recent episode depressed (HCC) 05/03/2020  ? Generalized anxiety disorder 05/03/2020  ? Major depressive disorder, recurrent episode (HCC) 04/06/2020  ? History of fetal anomaly in prior pregnancy, currently pregnant 05/09/2014  ? ? ?The following portions of the patient's history were reviewed and updated as appropriate: allergies, current medications, past family history, past medical history, past social history, past surgical history and problem list. Problem list updated. ? ?Objective:  ? ?Vitals:  ? 09/06/21 0949  ?BP: 121/75  ?Pulse: (!) 105  ?Weight: 239 lb 11.2 oz (108.7 kg)  ? ? ?Fetal Status: Fetal Heart Rate (bpm): 147   Movement: Present    ? ?General:  Alert, oriented and cooperative. Patient is in no acute distress.  ?Skin: Skin is warm  and dry.   ?Cardiovascular: Regular rate and rhythm.  ?Respiratory: Normal respiratory effort. CTA-Bilaterally  ?Abdomen: Soft, gravid, appropriate for gestational age.  ?Pelvic: Cervical exam deferred        ?Extremities: Normal range of motion.  Edema: None  ?Mental Status: Normal mood and affect. Normal behavior. Normal judgment and thought content.  ? ?Assessment and Plan:  ?Pregnancy: [id number] at [redacted]w[redacted]d ? ?1. [redacted] weeks gestation of pregnancy ?-Return visit for routine f/u in 1 week ?- Culture, beta strep (group b only) -- ([facility]) ?- Cervicovaginal ancillary only( [place]) ? ?2. Supervision of low-risk pregnancy, third trimester ?- doing well at today's visit ?- Culture, beta strep (group b only) ?- Cervicovaginal ancillary only( [place]) ? ?3. Acute bilateral low back pain without sciatica ?- well managed with flexeril ? ?4. Dysuria during pregnancy, antepartum, third trimester ?- UA, Urine Culture collected ?- MacroBid x3 days, re-evaluate need for further management once urine culture results ?-Discussed bladder irritants and lifestyle modifications ?-Discussed symptoms normal in pregnancy  ? ? ?Preterm labor symptoms and general obstetric precautions including but not limited to vaginal bleeding, contractions, leaking of fluid and fetal movement were reviewed with the patient. ? ?Please refer to After Visit Summary for other counseling recommendations.  ?Return in about 1 week (around 09/13/2021) for OFFICE OB VISIT (MD or APP). ? ?No future appointments. ? ?Corlis Hove, NP ?09/06/2021, 10:35 AM  ?

## 2021-09-06 NOTE — Patient Instructions (Signed)
Return to office for any scheduled appointments. Call the office or go to the MAU at [facility] [place] if:  You begin to have strong, frequent contractions  Your water breaks.  Sometimes it is a big gush of fluid, sometimes it is just a trickle that keeps getting your panties wet or running down your legs  You have vaginal bleeding.  It is normal to have a small amount of spotting if your cervix was checked.   You do not feel your baby moving like normal.  If you do not, get something to eat and drink and lay down and focus on feeling your baby move.   If your baby is still not moving like normal, you should call the office or go to MAU.  Any other obstetric concerns.   

## 2021-09-07 LAB — CERVICOVAGINAL ANCILLARY ONLY
Chlamydia: NEGATIVE
Comment: NEGATIVE
Comment: NORMAL
Neisseria Gonorrhea: NEGATIVE

## 2021-09-08 LAB — CULTURE, OB URINE

## 2021-09-08 LAB — URINE CULTURE, OB REFLEX

## 2021-09-10 LAB — CULTURE, BETA STREP (GROUP B ONLY): Strep Gp B Culture: NEGATIVE

## 2021-09-14 ENCOUNTER — Encounter: Payer: Self-pay | Admitting: Obstetrics & Gynecology

## 2021-09-14 ENCOUNTER — Ambulatory Visit (INDEPENDENT_AMBULATORY_CARE_PROVIDER_SITE_OTHER): Payer: Medicaid Other | Admitting: Obstetrics & Gynecology

## 2021-09-14 VITALS — BP 118/77 | HR 115 | Wt 241.4 lb

## 2021-09-14 DIAGNOSIS — Z3A37 37 weeks gestation of pregnancy: Secondary | ICD-10-CM

## 2021-09-14 DIAGNOSIS — Z3493 Encounter for supervision of normal pregnancy, unspecified, third trimester: Secondary | ICD-10-CM

## 2021-09-14 NOTE — Progress Notes (Signed)
? ?  PRENATAL VISIT NOTE ? ?Subjective:  ?Gail Robinson is a 31 y.o. [id number] at [redacted]w[redacted]d being seen today for ongoing prenatal care.  She is currently monitored for the following issues for this low-risk pregnancy and has History of fetal anomaly in prior pregnancy, currently pregnant; Major depressive disorder, recurrent episode (HCC); Bipolar I disorder, most recent episode depressed (HCC); Generalized anxiety disorder; Supervision of low-risk pregnancy; and Heart palpitations on their problem list. ? ?Patient reports no complaints.  Contractions: Not present. Vag. Bleeding: None.  Movement: Present. Denies leaking of fluid.  ? ?The following portions of the patient's history were reviewed and updated as appropriate: allergies, current medications, past family history, past medical history, past social history, past surgical history and problem list.  ? ?Objective:  ? ?Vitals:  ? 09/14/21 1407  ?BP: 118/77  ?Pulse: (!) 115  ?Weight: 241 lb 6.4 oz (109.5 kg)  ? ? ?Fetal Status: Fetal Heart Rate (bpm): 153 Fundal Height: 37 cm Movement: Present    ? ?General:  Alert, oriented and cooperative. Patient is in no acute distress.  ?Skin: Skin is warm and dry. No rash noted.   ?Cardiovascular: Normal heart rate noted  ?Respiratory: Normal respiratory effort, no problems with respiration noted  ?Abdomen: Soft, gravid, appropriate for gestational age.  Pain/Pressure: Absent     ?Pelvic: Cervical exam deferred        ?Extremities: Normal range of motion.  Edema: None  ?Mental Status: Normal mood and affect. Normal behavior. Normal judgment and thought content.  ? ?Assessment and Plan:  ?Pregnancy: [id number] at [redacted]w[redacted]d ?1. [redacted] weeks gestation of pregnancy ?2. Encounter for supervision of low-risk pregnancy in third trimester ?Negative pelvic cultures. Labor symptoms and general obstetric precautions including but not limited to vaginal bleeding, contractions, leaking of fluid and fetal movement were reviewed in detail with the  patient. ?Please refer to After Visit Summary for other counseling recommendations.  ? ?Return in about 1 week (around 09/21/2021) for OFFICE OB VISITs as scheduled. ? ?Future Appointments  ?Date Time Provider Department Center  ?09/21/2021  1:55 PM Milas Hock, MD [facility]  ?09/29/2021 10:15 AM Bernerd Limbo, CNM [facility]  ? ? ?Jaynie Collins, MD ? ?

## 2021-09-14 NOTE — Patient Instructions (Addendum)
Return to office for any scheduled appointments. Call the office or go to the MAU at [place] [facility] if: ?You begin to have strong, frequent contractions ?Your water breaks.  Sometimes it is a big gush of fluid, sometimes it is just a trickle that keeps getting your panties wet or running down your legs ?You have vaginal bleeding.  It is normal to have a small amount of spotting if your cervix was checked.  ?You do not feel your baby moving like normal.  If you do not, get something to eat and drink and lay down and focus on feeling your baby move.   If your baby is still not moving like normal, you should call the office or go to MAU. ?Any other obstetric concerns. ? ? ? ?Cervical Ripening (to get your cervix ready for labor) : May try one or all: ? ?Walking,  intercourse, nipple stimulation ? ?Red Raspberry Leaf capsules:  two 300mg  or 400mg  tablets with each meal, 2-3 times a day  ?Potential Side Effects Of Raspberry Leaf:  ?Most women do not experience any side effects from drinking raspberry leaf tea. However, nausea and loose stools are possible  ? ?Evening Primrose Oil capsules: may take 1 to 3 capsules daily. May also prick one to release the oil and insert it into your vagina at night.  ?Some of the potential side effects:  ?Upset stomach  ?Loose stools or diarrhea  ?Headaches  ?Nausea ? ?6 Dates a day (may taste better if warmed in microwave until soft). Found where raisins are in the grocery store ? ?

## 2021-09-21 ENCOUNTER — Encounter: Payer: Self-pay | Admitting: Obstetrics and Gynecology

## 2021-09-21 ENCOUNTER — Ambulatory Visit (INDEPENDENT_AMBULATORY_CARE_PROVIDER_SITE_OTHER): Payer: Medicaid Other | Admitting: Obstetrics and Gynecology

## 2021-09-21 VITALS — BP 123/70 | HR 131 | Wt 243.0 lb

## 2021-09-21 DIAGNOSIS — Z3493 Encounter for supervision of normal pregnancy, unspecified, third trimester: Secondary | ICD-10-CM

## 2021-09-21 NOTE — Progress Notes (Signed)
? ?  PRENATAL VISIT NOTE ? ?Subjective:  ?Gail Robinson is a 31 y.o. [id number] at [redacted]w[redacted]d being seen today for ongoing prenatal care.  She is currently monitored for the following issues for this low-risk pregnancy and has History of fetal anomaly in prior pregnancy, currently pregnant; Major depressive disorder, recurrent episode (HCC); Bipolar I disorder, most recent episode depressed (HCC); Generalized anxiety disorder; Supervision of low-risk pregnancy; and Heart palpitations on their problem list. ? ?Patient reports no complaints except back pain which is managed with flexeril.  She stopped working and they want a letter for why.  Contractions: Irritability. Vag. Bleeding: None.  Movement: Present. Denies leaking of fluid.  ? ?The following portions of the patient's history were reviewed and updated as appropriate: allergies, current medications, past family history, past medical history, past social history, past surgical history and problem list.  ? ?Objective:  ? ?Vitals:  ? 09/21/21 1421  ?BP: 123/70  ?Pulse: (!) 131  ?Weight: 243 lb (110.2 kg)  ? ? ?Fetal Status: Fetal Heart Rate (bpm): 155   Movement: Present    ? ?General:  Alert, oriented and cooperative. Patient is in no acute distress.  ?Skin: Skin is warm and dry. No rash noted.   ?Cardiovascular: Normal heart rate noted  ?Respiratory: Normal respiratory effort, no problems with respiration noted  ?Abdomen: Soft, gravid, appropriate for gestational age.  Pain/Pressure: Present     ?Pelvic: Cervical exam performed in the presence of a chaperone        ?Extremities: Normal range of motion.  Edema: None  ?Mental Status: Normal mood and affect. Normal behavior. Normal judgment and thought content.  ? ?Assessment and Plan:  ?Pregnancy: [id number] at [redacted]w[redacted]d ?1. Encounter for supervision of low-risk pregnancy in third trimester ?- Reviewed timing of delivery. Desires to wait - schedule for NST at same time as next appt. She will need BPP/NST ?- GBS  neg ? ?Term labor symptoms and general obstetric precautions including but not limited to vaginal bleeding, contractions, leaking of fluid and fetal movement were reviewed in detail with the patient. ?Please refer to After Visit Summary for other counseling recommendations.  ? ?Return for Needs BPP/NST weekly. ? ?Future Appointments  ?Date Time Provider Department Center  ?09/29/2021 10:15 AM Bernerd Limbo, CNM [facility]  ? ? ?Milas Hock, MD ?

## 2021-09-25 ENCOUNTER — Encounter: Payer: Self-pay | Admitting: *Deleted

## 2021-09-27 ENCOUNTER — Other Ambulatory Visit: Payer: Medicaid Other

## 2021-09-29 ENCOUNTER — Ambulatory Visit (INDEPENDENT_AMBULATORY_CARE_PROVIDER_SITE_OTHER): Payer: Medicaid Other | Admitting: Certified Nurse Midwife

## 2021-09-29 VITALS — BP 124/75 | HR 106 | Wt 241.7 lb

## 2021-09-29 DIAGNOSIS — Z3A40 40 weeks gestation of pregnancy: Secondary | ICD-10-CM

## 2021-09-29 DIAGNOSIS — Z3493 Encounter for supervision of normal pregnancy, unspecified, third trimester: Secondary | ICD-10-CM

## 2021-09-30 ENCOUNTER — Encounter: Payer: Self-pay | Admitting: Radiology

## 2021-10-02 ENCOUNTER — Other Ambulatory Visit: Payer: Medicaid Other

## 2021-10-02 ENCOUNTER — Ambulatory Visit (INDEPENDENT_AMBULATORY_CARE_PROVIDER_SITE_OTHER): Payer: Medicaid Other

## 2021-10-02 ENCOUNTER — Ambulatory Visit (INDEPENDENT_AMBULATORY_CARE_PROVIDER_SITE_OTHER): Payer: Medicaid Other | Admitting: Obstetrics and Gynecology

## 2021-10-02 ENCOUNTER — Ambulatory Visit: Payer: Medicaid Other | Admitting: *Deleted

## 2021-10-02 ENCOUNTER — Encounter (HOSPITAL_COMMUNITY): Payer: Self-pay | Admitting: *Deleted

## 2021-10-02 ENCOUNTER — Telehealth (HOSPITAL_COMMUNITY): Payer: Self-pay | Admitting: *Deleted

## 2021-10-02 ENCOUNTER — Encounter: Payer: Self-pay | Admitting: Obstetrics and Gynecology

## 2021-10-02 VITALS — BP 114/66 | HR 101 | Wt 249.1 lb

## 2021-10-02 DIAGNOSIS — O480 Post-term pregnancy: Secondary | ICD-10-CM

## 2021-10-02 DIAGNOSIS — Z3493 Encounter for supervision of normal pregnancy, unspecified, third trimester: Secondary | ICD-10-CM

## 2021-10-02 NOTE — Progress Notes (Signed)
Subjective:  ?Gail Robinson is a 31 y.o. [id number] at [redacted]w[redacted]d being seen today for ongoing prenatal care.  She is currently monitored for the following issues for this low-risk pregnancy and has History of fetal anomaly in prior pregnancy, currently pregnant; Major depressive disorder, recurrent episode (HCC); Bipolar I disorder, most recent episode depressed (HCC); Generalized anxiety disorder; Supervision of low-risk pregnancy; and Heart palpitations on their problem list. ? ?Patient reports general discomforts of pregnancy.  Contractions: Not present. Vag. Bleeding: None.  Movement: Present. Denies leaking of fluid.  ? ?The following portions of the patient's history were reviewed and updated as appropriate: allergies, current medications, past family history, past medical history, past social history, past surgical history and problem list. Problem list updated. ? ?Objective:  ? ?Vitals:  ? 10/02/21 0908  ?BP: 114/66  ?Pulse: (!) 101  ?Weight: 249 lb 1.6 oz (113 kg)  ? ? ?Fetal Status: Fetal Heart Rate (bpm): RNST   Movement: Present    ? ?General:  Alert, oriented and cooperative. Patient is in no acute distress.  ?Skin: Skin is warm and dry. No rash noted.   ?Cardiovascular: Normal heart rate noted  ?Respiratory: Normal respiratory effort, no problems with respiration noted  ?Abdomen: Soft, gravid, appropriate for gestational age. Pain/Pressure: Present     ?Pelvic:  Cervical exam performed        ?Extremities: Normal range of motion.  Edema: None  ?Mental Status: Normal mood and affect. Normal behavior. Normal judgment and thought content.  ? ?Urinalysis:     ? ?Assessment and Plan:  ?Pregnancy: [id number] at [redacted]w[redacted]d ? ?1. Encounter for supervision of low-risk pregnancy in third trimester ?Labor precautions ?IOL scheduled ? ?2. Post term pregnancy, antepartum condition or complication ?BPP/NST today ? ?Term labor symptoms and general obstetric precautions including but not limited to vaginal bleeding,  contractions, leaking of fluid and fetal movement were reviewed in detail with the patient. ?Please refer to After Visit Summary for other counseling recommendations.  ?No follow-ups on file. ? ? ?Hermina Staggers, MD ?

## 2021-10-02 NOTE — Telephone Encounter (Signed)
Preadmission screen  

## 2021-10-02 NOTE — Patient Instructions (Signed)
Vaginal Delivery  Vaginal delivery means that you give birth by pushing your baby out of your birth canal (vagina). Your health care team will help you before, during, and after vaginal delivery. Birth experiences are unique for every woman and every pregnancy, and birth experiences vary depending on where you choose to give birth. What are the risks and benefits? Generally, this is safe. However, problems may occur, including: Bleeding. Infection. Damage to other structures such as vaginal tearing. Allergic reactions to medicines. Despite the risks, benefits of vaginal delivery include less risk of bleeding and infection and a shorter recovery time compared to a Cesarean delivery. Cesarean delivery, or C-section, is the surgical delivery of a baby. What happens when I arrive at the birth center or hospital? Once you are in labor and have been admitted into the hospital or birth center, your health care team may: Review your pregnancy history and any concerns that you have. Talk with you about your birth plan and discuss pain control options. Check your blood pressure, breathing, and heartbeat. Assess your baby's heartbeat. Monitor your uterus for contractions. Check whether your bag of water (amniotic sac) has broken (ruptured). Insert an IV into one of your veins. This may be used to give you fluids and medicines. Monitoring Your health care team may assess your contractions (uterine monitoring) and your baby's heart rate (fetal monitoring). You may need to be monitored: Often, but not continuously (intermittently). All the time or for long periods at a time (continuously). Continuous monitoring may be needed if: You are taking certain medicines, such as medicine to relieve pain or make your contractions stronger. You have pregnancy or labor complications. Monitoring may be done by: Placing a special stethoscope or a handheld monitoring device on your abdomen to check your baby's  heartbeat and to check for contractions. Placing monitors on your abdomen (external monitors) to record your baby's heartbeat and the frequency and length of contractions. Placing monitors inside your uterus through your vagina (internal monitors) to record your baby's heartbeat and the frequency, length, and strength of your contractions. Depending on the type of monitor, it may remain in your uterus or on your baby's head until birth. Telemetry. This is a type of continuous monitoring that can be done with external or internal monitors. Instead of having to stay in bed, you are able to move around. Physical exam Your health care team may perform frequent physical exams. This may include: Checking how and where your baby is positioned in your uterus. Checking your cervix to determine: Whether it is thinning out (effacing). Whether it is opening up (dilating). What happens during labor and delivery?  Normal labor and delivery is divided into the following three stages: Stage 1 This is the longest stage of labor. Throughout this stage, you will feel contractions. Contractions generally feel mild, infrequent, and irregular at first. They get stronger, more frequent, and more regular as you move through this stage. You may have contractions about every 2-3 minutes. This stage ends when your cervix is completely dilated to 4 inches (10 cm) and completely effaced. Stage 2 This stage starts once your cervix is completely effaced and dilated and lasts until the delivery of your baby. This is the stage where you will feel an urge to push your baby out of your vagina. You may feel stretching and burning pain, especially when the widest part of your baby's head passes through the vaginal opening (crowning). Once your baby is delivered, the umbilical cord will be   clamped and cut. Timing of cutting the cord will depend on your wishes, your baby's health, and your health care provider's practices. Your baby  will be placed on your bare chest (skin-to-skin contact) in an upright position and covered with a warm blanket. If you are choosing to breastfeed, watch your baby for feeding cues, like rooting or sucking, and help the baby to your breast for his or her first feeding. Stage 3 This stage starts immediately after the birth of your baby and ends after you deliver the placenta. This stage may take anywhere from 5 to 30 minutes. After your baby has been delivered, you will feel contractions as your body expels the placenta. These contractions also help your uterus get smaller and reduce bleeding. What can I expect after labor and delivery? After labor is over, you and your baby will be assessed closely until you are ready to go home. Your health care team will teach you how to care for yourself and your baby. You and your baby may be encouraged to stay in the same room (rooming in) during your hospital stay. This will help promote early bonding and successful breastfeeding. Your uterus will be checked and massaged regularly (fundal massage). You may continue to receive fluids and medicines through an IV. You will have some soreness and pain in your abdomen, vagina, and the area of skin between your vaginal opening and your anus (perineum). If an incision was made near your vagina (episiotomy) or if you had some vaginal tearing during delivery, cold compresses may be placed on your episiotomy or your tear. This helps to reduce pain and swelling. It is normal to have vaginal bleeding after delivery. Wear a sanitary pad for vaginal bleeding and discharge. Summary Vaginal delivery means that you will give birth by pushing your baby out of your birth canal (vagina). Your health care team will monitor you and your baby throughout the stages of labor. After you deliver your baby, your health care team will continue to assess you and your baby to ensure you are both recovering as expected after delivery. This  information is not intended to replace advice given to you by your health care provider. Make sure you discuss any questions you have with your health care provider. Document Revised: 04/25/2020 Document Reviewed: 04/25/2020 Elsevier Patient Education  2023 [employer]  

## 2021-10-02 NOTE — Progress Notes (Signed)
? ?  PRENATAL VISIT NOTE ? ?Subjective:  ?Gail Robinson is a 31 y.o. [id number] at [redacted]w[redacted]d being seen today for ongoing prenatal care.  She is currently monitored for the following issues for this low-risk pregnancy and has History of fetal anomaly in prior pregnancy, currently pregnant; Major depressive disorder, recurrent episode (HCC); Bipolar I disorder, most recent episode depressed (HCC); Generalized anxiety disorder; Supervision of low-risk pregnancy; and Heart palpitations on their problem list. ? ?Patient reports no complaints.  Contractions: Not present. Vag. Bleeding: None.  Movement: Present. Denies leaking of fluid.  ? ?The following portions of the patient's history were reviewed and updated as appropriate: allergies, current medications, past family history, past medical history, past social history, past surgical history and problem list.  ? ?Objective:  ? ?Vitals:  ? 09/29/21 1200  ?BP: 124/75  ?Pulse: (!) 106  ?Weight: 241 lb 11.2 oz (109.6 kg)  ? ? ?Fetal Status: Fetal Heart Rate (bpm): 145 Fundal Height: 39 cm Movement: Present  Presentation: Vertex ? ?General:  Alert, oriented and cooperative. Patient is in no acute distress.  ?Skin: Skin is warm and dry. No rash noted.   ?Cardiovascular: Normal heart rate noted  ?Respiratory: Normal respiratory effort, no problems with respiration noted  ?Abdomen: Soft, gravid, appropriate for gestational age.  Pain/Pressure: Present     ?Pelvic: Cervical exam deferred        ?Extremities: Normal range of motion.  Edema: None  ?Mental Status: Normal mood and affect. Normal behavior. Normal judgment and thought content.  ? ?Assessment and Plan:  ?Pregnancy: [id number] at [redacted]w[redacted]d ?1. Supervision of low-risk pregnancy, third trimester ?- Doing well, feeling regular and vigorous fetal movement  ? ?2. [redacted] weeks gestation of pregnancy ?- Routine OB care  ?- Has NST scheduled for 10/02/21 ? ?Term labor symptoms and general obstetric precautions including but not limited to  vaginal bleeding, contractions, leaking of fluid and fetal movement were reviewed in detail with the patient. ?Please refer to After Visit Summary for other counseling recommendations.  ? ?Future Appointments  ?Date Time Provider Department Center  ?10/06/2021  7:15 AM [facility] SCHED ROOM [facility] None  ? ? ?Bernerd Limbo, CNM ?

## 2021-10-03 ENCOUNTER — Encounter (HOSPITAL_COMMUNITY): Payer: Self-pay | Admitting: Obstetrics and Gynecology

## 2021-10-03 ENCOUNTER — Inpatient Hospital Stay (HOSPITAL_COMMUNITY): Payer: Medicaid Other | Admitting: Anesthesiology

## 2021-10-03 ENCOUNTER — Inpatient Hospital Stay (HOSPITAL_COMMUNITY)
Admission: AD | Admit: 2021-10-03 | Discharge: 2021-10-05 | DRG: 806 | Disposition: A | Discharge status: Home / Self Care | Payer: Medicaid Other | Attending: Obstetrics and Gynecology | Admitting: Obstetrics and Gynecology

## 2021-10-03 ENCOUNTER — Other Ambulatory Visit: Payer: Self-pay

## 2021-10-03 DIAGNOSIS — D563 Thalassemia minor: Secondary | ICD-10-CM | POA: Diagnosis present

## 2021-10-03 DIAGNOSIS — Z3A40 40 weeks gestation of pregnancy: Secondary | ICD-10-CM

## 2021-10-03 DIAGNOSIS — F1290 Cannabis use, unspecified, uncomplicated: Secondary | ICD-10-CM | POA: Diagnosis present

## 2021-10-03 DIAGNOSIS — Z87891 Personal history of nicotine dependence: Secondary | ICD-10-CM | POA: Diagnosis not present

## 2021-10-03 DIAGNOSIS — O480 Post-term pregnancy: Principal | ICD-10-CM | POA: Diagnosis present

## 2021-10-03 DIAGNOSIS — O99324 Drug use complicating childbirth: Secondary | ICD-10-CM

## 2021-10-03 LAB — CBC
HCT: 42.4 % (ref 36.0–46.0)
Hemoglobin: 14.4 g/dL (ref 12.0–15.0)
MCH: 26 pg (ref 26.0–34.0)
MCHC: 34 g/dL (ref 30.0–36.0)
MCV: 76.5 fL — ABNORMAL LOW (ref 80.0–100.0)
Platelets: 164 10*3/uL (ref 150–400)
RBC: 5.54 MIL/uL — ABNORMAL HIGH (ref 3.87–5.11)
RDW: 14.7 % (ref 11.5–15.5)
WBC: 13.4 10*3/uL — ABNORMAL HIGH (ref 4.0–10.5)
nRBC: 0 % (ref 0.0–0.2)

## 2021-10-03 MED ORDER — LACTATED RINGERS IV SOLN: 500.0000 mL | INTRAVENOUS | Status: DC | PRN

## 2021-10-03 MED ORDER — PHENYLEPHRINE 80 MCG/ML (10ML) SYRINGE FOR IV PUSH (FOR BLOOD PRESSURE SUPPORT): 80.0000 ug | PREFILLED_SYRINGE | INTRAVENOUS | Status: DC | PRN

## 2021-10-03 MED ORDER — LACTATED RINGERS IV SOLN
500.0000 mL | Freq: Once | INTRAVENOUS | Status: AC
  Administered 2021-10-03: 500 mL via INTRAVENOUS

## 2021-10-03 MED ORDER — SOD CITRATE-CITRIC ACID 500-334 MG/5ML PO SOLN: 30.0000 mL | ORAL | Status: DC | PRN

## 2021-10-03 MED ORDER — OXYCODONE-ACETAMINOPHEN 5-325 MG PO TABS: 2.0000 | ORAL_TABLET | ORAL | Status: DC | PRN

## 2021-10-03 MED ORDER — LIDOCAINE HCL (PF) 1 % IJ SOLN
INTRAMUSCULAR | Status: DC | PRN
  Administered 2021-10-03: 10 mL via EPIDURAL

## 2021-10-03 MED ORDER — FENTANYL-BUPIVACAINE-NACL 0.5-0.125-0.9 MG/250ML-% EP SOLN
12.0000 mL/h | EPIDURAL | Status: DC | PRN
  Administered 2021-10-03: 12 mL/h via EPIDURAL
  Filled 2021-10-03: qty 250

## 2021-10-03 MED ORDER — FENTANYL CITRATE (PF) 100 MCG/2ML IJ SOLN
50.0000 ug | INTRAMUSCULAR | Status: DC | PRN
  Administered 2021-10-03: 100 ug via INTRAVENOUS
  Filled 2021-10-03: qty 2

## 2021-10-03 MED ORDER — LIDOCAINE HCL (PF) 1 % IJ SOLN: 30.0000 mL | INTRAMUSCULAR | Status: DC | PRN

## 2021-10-03 MED ORDER — OXYCODONE-ACETAMINOPHEN 5-325 MG PO TABS: 1.0000 | ORAL_TABLET | ORAL | Status: DC | PRN

## 2021-10-03 MED ORDER — OXYTOCIN BOLUS FROM INFUSION
333.0000 mL | Freq: Once | INTRAVENOUS | Status: AC
  Administered 2021-10-03: 333 mL via INTRAVENOUS

## 2021-10-03 MED ORDER — ACETAMINOPHEN 325 MG PO TABS: 650.0000 mg | ORAL_TABLET | ORAL | Status: DC | PRN

## 2021-10-03 MED ORDER — EPHEDRINE 5 MG/ML INJ: 10.0000 mg | INTRAVENOUS | Status: DC | PRN

## 2021-10-03 MED ORDER — DIPHENHYDRAMINE HCL 50 MG/ML IJ SOLN: 12.5000 mg | INTRAMUSCULAR | Status: DC | PRN

## 2021-10-03 MED ORDER — ONDANSETRON HCL 4 MG/2ML IJ SOLN
4.0000 mg | Freq: Four times a day (QID) | INTRAMUSCULAR | Status: DC | PRN
  Administered 2021-10-03: 4 mg via INTRAVENOUS
  Filled 2021-10-03: qty 2

## 2021-10-03 MED ORDER — OXYTOCIN-SODIUM CHLORIDE 30-0.9 UT/500ML-% IV SOLN
2.5000 [IU]/h | INTRAVENOUS | Status: DC
  Administered 2021-10-03: 2.5 [IU]/h via INTRAVENOUS
  Filled 2021-10-03: qty 500

## 2021-10-03 MED ORDER — LACTATED RINGERS IV SOLN: INTRAVENOUS | Status: DC

## 2021-10-03 NOTE — MAU Note (Addendum)
Patient reports ctx that started around 1630. Also states that mucous plug came out today. Patient denies vaginal bleeding or LOF. Patient also endorses +FM.  ?

## 2021-10-03 NOTE — H&P (Addendum)
OBSTETRIC ADMISSION HISTORY AND PHYSICAL ? ?Gail Robinson is a 31 y.o. female [phone] with IUP at [redacted]w[redacted]d by [redacted]w[redacted]d [place] presenting for active labor. She reports +FMs, No LOF, no VB, no blurry vision, headaches or peripheral edema, and RUQ pain.  She plans on breast feeding. She request POPs for birth control. ?She received her prenatal care at [facility]  ? ?Dating: By [redacted]w[redacted]d --->  Estimated Date of Delivery: 09/29/21 ? ?Sono:   ? ?$R'@[redacted]w[redacted]d'gd$ , CWD, normal anatomy, cephalic presentation, anterior lie, 1919g, 49% EFW ? ? ?Prenatal History/Complications:  ?- alpha thalassemia carrier ?- h/o fetal anomaly in prior pregnancy  ? ?Past Medical History: ?Past Medical History:  ?Diagnosis Date  ? Asthma   ? childhood  ? Heartburn   ? Lightheaded   ? Palpitations   ? ? ?Past Surgical History: ?Past Surgical History:  ?Procedure Laterality Date  ? NO PAST SURGERIES    ? ? ?Obstetrical History: ?OB History   ? ? Gravida  ?5  ? Para  ?2  ? Term  ?2  ? Preterm  ?   ? AB  ?2  ? Living  ?2  ?  ? ? SAB  ?   ? IAB  ?2  ? Ectopic  ?   ? Multiple  ?0  ? Live Births  ?2  ?   ?  ?  ? ? ?Social History ?Social History  ? ?Socioeconomic History  ? Marital status: Single  ?  Spouse name: Not on file  ? Number of children: Not on file  ? Years of education: Not on file  ? Highest education level: Not on file  ?Occupational History  ? Not on file  ?Tobacco Use  ? Smoking status: Former  ?  Packs/day: 0.30  ?  Types: Cigarettes  ?  Quit date: 12/23/2013  ?  Years since quitting: 7.7  ? Smokeless tobacco: Never  ?Vaping Use  ? Vaping Use: Never used  ?Substance and Sexual Activity  ? Alcohol use: Not Currently  ?  Comment: 1 pint to 2 pints alcohol per week.   ? Drug use: Yes  ?  Types: Marijuana  ?  Comment: last used 07/2021  ? Sexual activity: Yes  ?  Birth control/protection: Pill  ?Other Topics Concern  ? Not on file  ?Social History Narrative  ? Not on file  ? ?Social Determinants of Health  ? ?Financial Resource Strain: Not on file  ?Food Insecurity:  Food Insecurity Present  ? Worried About Charity fundraiser in the Last Year: Sometimes true  ? Ran Out of Food in the Last Year: Sometimes true  ?Transportation Needs: No Transportation Needs  ? Lack of Transportation (Medical): No  ? Lack of Transportation (Non-Medical): No  ?Physical Activity: Not on file  ?Stress: Not on file  ?Social Connections: Not on file  ? ? ?Family History: ?Family History  ?Problem Relation Age of Onset  ? Healthy Mother   ? Healthy Father   ? ? ?Allergies: ?No Known Allergies ? ?Medications Prior to Admission  ?Medication Sig Dispense Refill Last Dose  ? cyclobenzaprine (FLEXERIL) 10 MG tablet Take 1 tablet (10 mg total) by mouth every 8 (eight) hours as needed for muscle spasms. 30 tablet 1 Past Week  ? prenatal vitamin w/FE, FA (PRENATAL 1 + 1) 27-1 MG TABS tablet Take 1 tablet by mouth daily at 12 noon. 30 tablet 11 10/02/2021  ? Blood Pressure Monitoring (BLOOD PRESSURE KIT) DEVI 1 Device by Does  not apply route as needed. 1 each 0   ? ? ? ?Review of Systems  ? ?All systems reviewed and negative except as stated in HPI ? ?Blood pressure (!) 107/57, pulse (!) 124, temperature (!) 97.2 ?F (36.2 ?C), temperature source Axillary, resp. rate 18, last menstrual period 12/06/2020, SpO2 100 %. ?General appearance: alert and cooperative ?Lungs: clear to auscultation bilaterally ?Heart: regular rate and rhythm ?Abdomen: soft, non-tender; bowel sounds normal ?Extremities: Homans sign is negative, no sign of DVT ?Presentation: cephalic ?Fetal monitoring 140 bpm/moderate variability/+accels ?Uterine activity contractions present  ?Dilation: 8 ?Effacement (%): 90 ?Station: -2 ?Exam by:: Fayrene Fearing RN ? ? ?Prenatal labs: ?ABO, Rh: --/--/B POS (04/25 2025) ?Antibody: NEG (04/25 2025) ?Rubella: 7.91 (11/11 1128) ?RPR: Non Reactive (01/27 0915)  ?HBsAg: Negative (11/11 1128)  ?HIV: Non Reactive (01/27 0915)  ?GBS: Negative/-- (03/29 1404)  ?1 hr Glucola normal  ?Genetic screening  NIPS: low  risk, AFP: normal, Horizon: alpha thalassemia carrier  ?Anatomy US normal ? ?Prenatal Transfer Tool  ?Maternal Diabetes: No ?Genetic Screening: Normal ?Maternal Ultrasounds/Referrals: Normal ?Fetal Ultrasounds or other Referrals:  None ?Maternal Substance Abuse:  No ?Significant Maternal Medications:  None ?Significant Maternal Lab Results: None ? ?Results for orders placed or performed during the hospital encounter of 10/03/21 (from the past 24 hour(s))  ?CBC  ? Collection Time: 10/03/21  8:25 PM  ?Result Value Ref Range  ? WBC 13.4 (H) 4.0 - 10.5 K/uL  ? RBC 5.54 (H) 3.87 - 5.11 MIL/uL  ? Hemoglobin 14.4 12.0 - 15.0 g/dL  ? HCT 42.4 36.0 - 46.0 %  ? MCV 76.5 (L) 80.0 - 100.0 fL  ? MCH 26.0 26.0 - 34.0 pg  ? MCHC 34.0 30.0 - 36.0 g/dL  ? RDW 14.7 11.5 - 15.5 %  ? Platelets 164 150 - 400 K/uL  ? nRBC 0.0 0.0 - 0.2 %  ?Type and screen  ? Collection Time: 10/03/21  8:25 PM  ?Result Value Ref Range  ? ABO/RH(D) B POS   ? Antibody Screen NEG   ? Sample Expiration    ?  10/06/2021,2359 ?Performed at [facility], [place] [address] [place], [place] [address] ?  ? ? ?Patient Active Problem List  ? Diagnosis Date Noted  ? Post-dates pregnancy 10/03/2021  ? Heart palpitations 08/07/2021  ? Supervision of low-risk pregnancy 04/21/2021  ? Bipolar I disorder, most recent episode depressed ([place]) 05/03/2020  ? Generalized anxiety disorder 05/03/2020  ? Major depressive disorder, recurrent episode ([place]) 04/06/2020  ? History of fetal anomaly in prior pregnancy, currently pregnant 05/09/2014  ? ? ?Assessment/Plan:  ?TELESHIA LEMERE is a 31 y.o. [id number] at [redacted]w[redacted]d here for active labor  ? ?#Labor: Start augmentation of labor. Started Pitocin ?#Pain: Epidural given  ?#FWB: Cat 1 ?#ID:  GBS negative ?#MOF: Breast ?#MOC: POPs ?#Circ:  N/a ? ?Armanda Magic, Medical Student  ?10/03/2021, 10:26 PM ? ?Attestation: ? ?I confirm that I have verified the information documented in the Medical Student?s note and that I have also personally  reperformed the physical exam and all medical decision making activities.  ? ?The patient was seen and examined by me also ?This is a 31 y.o. female at [redacted]w[redacted]d who presented in active labor ?She received and epidural for pain and quickly progressed to complete dilation with variable decelerations with contractions ? ?Agree with note ?Medical and family history noncontributory ? ?ROS:  Painful contractions ?           Intact membranes,  no bleeding ?           Full ROS incomplete due to Level 5 caveat (pain). ? ?HR RRR ?Respirations unlabored ?NST reactive and reassuring ?Abdomen gravid, appropriate for GA, nontender ? ?UCs as listed ?Cervical exams as listed in note ? ?Planned observation for labor progress ? ? ?Seabron Spates, CNM ? ? ?

## 2021-10-03 NOTE — Anesthesia Preprocedure Evaluation (Signed)
Anesthesia Evaluation  ?Patient identified by MRN, date of birth, ID band ?Patient awake ? ? ? ?Reviewed: ?Allergy & Precautions, H&P , NPO status , Patient's Chart, lab work & pertinent test results ? ?History of Anesthesia Complications ?Negative for: history of anesthetic complications ? ?Airway ?Mallampati: II ? ?TM Distance: >3 FB ? ? ? ? Dental ?  ?Pulmonary ?neg pulmonary ROS, former smoker,  ?  ?Pulmonary exam normal ? ? ? ? ? ? ? Cardiovascular ?negative cardio ROS ? ? ?Rhythm:regular Rate:Normal ? ? ?  ?Neuro/Psych ?Anxiety Depression Bipolar Disorder negative neurological ROS ? negative psych ROS  ? GI/Hepatic ?Neg liver ROS, GERD  ,  ?Endo/Other  ?Morbid obesity ? Renal/GU ?  ? ?  ?Musculoskeletal ? ? Abdominal ?  ?Peds ? Hematology ?negative hematology ROS ?(+)   ?Anesthesia Other Findings ? ? Reproductive/Obstetrics ?(+) Pregnancy ? ?  ? ? ? ? ? ? ? ? ? ? ? ? ? ?  ?  ? ? ? ? ? ? ? ? ?Anesthesia Physical ?Anesthesia Plan ? ?ASA: 3 ? ?Anesthesia Plan: Epidural  ? ?Post-op Pain Management:   ? ?Induction:  ? ?PONV Risk Score and Plan:  ? ?Airway Management Planned:  ? ?Additional Equipment:  ? ?Intra-op Plan:  ? ?Post-operative Plan:  ? ?Informed Consent: I have reviewed the patients History and Physical, chart, labs and discussed the procedure including the risks, benefits and alternatives for the proposed anesthesia with the patient or authorized representative who has indicated his/her understanding and acceptance.  ? ? ? ? ? ?Plan Discussed with:  ? ?Anesthesia Plan Comments:   ? ? ? ? ? ? ?Anesthesia Quick Evaluation ? ?

## 2021-10-03 NOTE — Discharge Summary (Signed)
? ?  Postpartum Discharge Summary ? ? ?   ?Patient Name: Gail Robinson ?DOB: May 09, 1991 ?MRN: [id number] ? ?Date of admission: 10/03/2021 ?Delivery date:10/03/2021  ?Delivering provider: SIMMONS-JOSILEVICH, JESSICA Tim [place]  ?Date of discharge: 10/05/2021 ? ?Admitting diagnosis: Post-dates pregnancy [O48.0] ?Intrauterine pregnancy: [redacted]w[redacted]d    ?Secondary diagnosis:  Principal Problem: ?  Post-dates pregnancy ? ?Additional problems: Marijuana Use, Late to care, Bipolar   ?Discharge diagnosis: Term Pregnancy Delivered                                              ?Post partum procedures: none ?Augmentation: AROM ?Complications: None ? ?Hospital course: Onset of Labor With Vaginal Delivery      ?31y.o. yo [id number] 43w4das admitted in Active Labor on 10/03/2021. Patient had an uncomplicated labor course as follows:  ?Membrane Rupture Time/Date: 10:39 PM ,10/03/2021   ?Delivery Method:Vaginal, Spontaneous  ?Episiotomy: None  ?Lacerations:  Labial  ?Patient had an uncomplicated postpartum course.  She is ambulating, tolerating a regular diet, passing flatus, and urinating well. Patient is discharged home in stable condition on 10/05/21. ? ?Newborn Data: ?Birth date:10/03/2021  ?Birth time:10:53 PM  ?Gender:Female  ?Living status:Living  ?Apgars:9 ,9  ?Weight:3290 g  ? ?Magnesium Sulfate received: No ?BMZ received: No ?Rhophylac:N/A ?MMR:N/A ?T-DaP:Given prenatally ?Flu: N/A ?Transfusion:No ? ?Physical exam  ?Vitals:  ? 10/04/21 1000 10/04/21 1429 10/04/21 1945 10/05/21 0529  ?BP: 121/73 125/78 129/66 (!) 103/55  ?Pulse: 83 78 73 66  ?Resp: _0 ?Temp: 98.1 ?F (36.7 ?C) 98.2 ?F (36.8 ?C) 98.5 ?F (36.9 ?C) 98.4 ?F (36.9 ?C)  ?TempSrc: Axillary Axillary Axillary Oral  ?SpO2: 98% 100%    ?Weight:      ?Height:      ? ?General: alert, cooperative, and no distress ?Lochia: appropriate ?Uterine Fundus: firm ?Incision: N/A ?DVT Evaluation: No evidence of DVT seen on physical exam. ?Labs: ?Lab Results  ?Component Value Date   ? WBC 13.4 (H) 10/03/2021  ? HGB 14.4 10/03/2021  ? HCT 42.4 10/03/2021  ? MCV 76.5 (L) 10/03/2021  ? PLT 164 10/03/2021  ? ? ?  Latest Ref Rng & Units 02/03/2021  ?  6:31 PM  ?CMP  ?Glucose 70 - 99 mg/dL 95    ?BUN 6 - 20 mg/dL 9    ?Creatinine 0.44 - 1.00 mg/dL 0.78    ?Sodium 135 - 145 mmol/L 135    ?Potassium 3.5 - 5.1 mmol/L 4.0    ?Chloride 98 - 111 mmol/L 102    ?CO2 22 - 32 mmol/L 24    ?Calcium 8.9 - 10.3 mg/dL 9.2    ?Total Protein 6.5 - 8.1 g/dL 6.9    ?Total Bilirubin 0.3 - 1.2 mg/dL 0.7    ?Alkaline Phos 38 - 126 U/L 72    ?AST 15 - 41 U/L 17    ?ALT 0 - 44 U/L 14    ? ?Edinburgh Score: ? ?  10/04/2021  ?  6:07 AM  ?EdFlavia Shipperostnatal Depression Scale Screening Tool  ?I have been able to laugh and see the funny side of things. 0  ?I have looked forward with enjoyment to things. 0  ?I have blamed myself unnecessarily when things went wrong. 1  ?I have been anxious or worried for no good reason. 1  ?I have felt scared or panicky for no good  reason. 0  ?Things have been getting on top of me. 1  ?I have been so unhappy that I have had difficulty sleeping. 0  ?I have felt sad or miserable. 1  ?I have been so unhappy that I have been crying. 1  ?The thought of harming myself has occurred to me. 0  ?Edinburgh Postnatal Depression Scale Total 5  ? ? ? ? ?After visit meds:  ?Allergies as of 10/05/2021   ?No Known Allergies ?  ? ?  ?Medication List  ?  ? ?STOP taking these medications   ? ?Blood Pressure Kit Devi ?  ? ?  ? ?TAKE these medications   ? ?cyclobenzaprine 10 MG tablet ?Commonly known as: FLEXERIL ?Take 1 tablet (10 mg total) by mouth every 8 (eight) hours as needed for muscle spasms. ?  ?ibuprofen 600 MG tablet ?Commonly known as: ADVIL ?Take 1 tablet (600 mg total) by mouth every 6 (six) hours. ?  ?prenatal vitamin w/FE, FA 27-1 MG Tabs tablet ?Take 1 tablet by mouth daily at 12 noon. ?  ? ?  ? ? ? ?Discharge home in stable condition ?Infant Feeding: Breast ?Infant Disposition:home with  mother ?Discharge instruction: per After Visit Summary and Postpartum booklet. ?Activity: Advance as tolerated. Pelvic rest for 6 weeks.  ?Diet: routine diet ?Anticipated Birth Control: POPs ?Postpartum Appointment:4 weeks ?Additional Postpartum F/U:  none ?Future Appointments: ?No future appointments. ? ?Follow up Visit: ? Follow-up Information   ? ? [facility]. Schedule an appointment as soon as possible for a visit in 4 week(s).   ?Specialty: Obstetrics and Gynecology ?Contact information: ?[place] ?[place] [address] ?[phone] ? ?  ?  ? ?  ?  ? ?  ? ? ? ?  ? ?10/05/2021 ?Hansel Feinstein, CNM ? ? ?

## 2021-10-03 NOTE — Anesthesia Procedure Notes (Signed)
Epidural ?Patient location during procedure: OB ?Start time: 10/03/2021 9:04 PM ?End time: 10/03/2021 9:13 PM ? ?Staffing ?Anesthesiologist: Lucretia Kern, MD ?Performed: anesthesiologist  ? ?Preanesthetic Checklist ?Completed: patient identified, IV checked, risks and benefits discussed, monitors and equipment checked, pre-op evaluation and timeout performed ? ?Epidural ?Patient position: sitting ?Prep: DuraPrep ?Patient monitoring: heart rate, continuous pulse ox and blood pressure ?Approach: midline ?Location: L3-L4 ?Injection technique: LOR air ? ?Needle:  ?Needle type: Tuohy  ?Needle gauge: 17 G ?Needle length: 9 cm ?Needle insertion depth: 7 cm ?Catheter type: closed end flexible ?Catheter size: 19 Gauge ?Catheter at skin depth: 12 cm ?Test dose: negative ? ?Assessment ?Events: blood not aspirated, injection not painful, no injection resistance, no paresthesia and negative IV test ? ?Additional Notes ?Reason for block:procedure for pain ? ? ? ?

## 2021-10-04 ENCOUNTER — Encounter (HOSPITAL_COMMUNITY): Payer: Self-pay | Admitting: Obstetrics and Gynecology

## 2021-10-04 LAB — TYPE AND SCREEN
ABO/RH(D): B POS
Antibody Screen: NEGATIVE

## 2021-10-04 LAB — RPR: RPR Ser Ql: NONREACTIVE

## 2021-10-04 LAB — BIRTH TISSUE RECOVERY COLLECTION (PLACENTA DONATION)

## 2021-10-04 MED ORDER — ZOLPIDEM TARTRATE 5 MG PO TABS: 5.0000 mg | ORAL_TABLET | Freq: Every evening | ORAL | Status: DC | PRN

## 2021-10-04 MED ORDER — IBUPROFEN 600 MG PO TABS
600.0000 mg | ORAL_TABLET | Freq: Four times a day (QID) | ORAL | Status: DC
Start: 2021-10-04 — End: 2021-10-05
  Administered 2021-10-04 – 2021-10-05 (×5): 600 mg via ORAL
  Filled 2021-10-04 (×6): qty 1

## 2021-10-04 MED ORDER — COCONUT OIL OIL
1.0000 "application " | TOPICAL_OIL | Status: DC | PRN
  Administered 2021-10-04: 1 via TOPICAL

## 2021-10-04 MED ORDER — ACETAMINOPHEN 325 MG PO TABS: 650.0000 mg | ORAL_TABLET | ORAL | Status: DC | PRN

## 2021-10-04 MED ORDER — SIMETHICONE 80 MG PO CHEW: 80.0000 mg | CHEWABLE_TABLET | ORAL | Status: DC | PRN

## 2021-10-04 MED ORDER — ONDANSETRON HCL 4 MG PO TABS: 4.0000 mg | ORAL_TABLET | ORAL | Status: DC | PRN

## 2021-10-04 MED ORDER — SENNOSIDES-DOCUSATE SODIUM 8.6-50 MG PO TABS
2.0000 | ORAL_TABLET | ORAL | Status: DC
  Administered 2021-10-04: 2 via ORAL
  Filled 2021-10-04 (×2): qty 2

## 2021-10-04 MED ORDER — ONDANSETRON HCL 4 MG/2ML IJ SOLN
4.0000 mg | INTRAMUSCULAR | Status: DC | PRN
Start: 2021-10-04 — End: 2021-10-05

## 2021-10-04 MED ORDER — BENZOCAINE-MENTHOL 20-0.5 % EX AERO
1.0000 | INHALATION_SPRAY | CUTANEOUS | Status: DC | PRN
Start: 2021-10-04 — End: 2021-10-05
  Administered 2021-10-04: 1 via TOPICAL
  Filled 2021-10-04: qty 56

## 2021-10-04 MED ORDER — TETANUS-DIPHTH-ACELL PERTUSSIS 5-2.5-18.5 LF-MCG/0.5 IM SUSY: 0.5000 mL | PREFILLED_SYRINGE | Freq: Once | INTRAMUSCULAR | Status: DC

## 2021-10-04 MED ORDER — WITCH HAZEL-GLYCERIN EX PADS: 1.0000 "application " | MEDICATED_PAD | CUTANEOUS | Status: DC | PRN

## 2021-10-04 MED ORDER — PRENATAL MULTIVITAMIN CH
1.0000 | ORAL_TABLET | Freq: Every day | ORAL | Status: DC
  Administered 2021-10-04 – 2021-10-05 (×2): 1 via ORAL
  Filled 2021-10-04 (×2): qty 1

## 2021-10-04 MED ORDER — DIBUCAINE (PERIANAL) 1 % EX OINT
1.0000 | TOPICAL_OINTMENT | CUTANEOUS | Status: DC | PRN
Start: 2021-10-04 — End: 2021-10-05

## 2021-10-04 MED ORDER — DIPHENHYDRAMINE HCL 25 MG PO CAPS: 25.0000 mg | ORAL_CAPSULE | Freq: Four times a day (QID) | ORAL | Status: DC | PRN

## 2021-10-04 NOTE — Clinical Social Work Maternal (Signed)
?CLINICAL SOCIAL WORK MATERNAL/CHILD NOTE ? ?Patient Details  ?Name: Gail Robinson ?MRN: [id number] ?Date of Birth: 03/13/1991 ? ?Date:  28-Sep-2021 ? ?Clinical Social Worker Initiating Note:  Kathrin Greathouse, LCSW Date/Time: Initiated:  10/04/21/1300    ? ?Child's Name:  Gail Robinson  ? ?Biological Parents:  Mother, Father (MOB: Gail Robinson 09/21/1989)  ? ?Need for Interpreter:  None  ? ?Reason for Referral:  Current Substance Use/Substance Use During Pregnancy  , Behavioral Health Concerns  ? ?[place] [address]  ?  ?Phone number:  [phone] (home)    ? ?Additional phone number:  ? ?Household Members/Support Persons (HM/SP):   Household Member/Support Person 1, Household Member/Support Person 2 ? ? ?HM/SP Name Relationship DOB or Age  ?HM/SP -1 Duan Scharnhorst Daughter 04-30-2014  ?HM/SP -2 Mariane Masters Son 01-03-2010  ?HM/SP -3        ?HM/SP -4        ?HM/SP -5        ?HM/SP -6        ?HM/SP -7        ?HM/SP -8        ? ? ?Natural Supports (not living in the home):  Extended Family, Immediate Family  ? ?Professional Supports: None  ? ?Employment: Unemployed  ? ?Type of Work:    ? ?Education:  High school graduate  ? ?Homebound arranged:   ? ?Financial Resources:  Medicaid  ? ?Other Resources:  WIC, Food Stamps    ? ?Cultural/Religious Considerations Which May Impact Care:   ? ?Strengths:  Ability to meet basic needs  , Home prepared for child  , Pediatrician chosen  ? ?Psychotropic Medications:        ? ?Pediatrician:    Careers adviser area [facility]) ? ?Pediatrician List:  ? ?[place]    ?[place] Other  ?[facility]    ?[facility]    ?[facility]    ?[facility]    ? ? ?Pediatrician Fax Number:   ? ?Risk Factors/Current Problems:  Substance Use  , Mental Health Concerns    ? ?Cognitive State:  Able to Concentrate  , Insightful  , Alert  , Linear Thinking    ? ?Mood/Affect:  Calm  , Comfortable  , Interested  ,  Tearful  , Bright  , Happy    ? ?CSW Assessment: CSW received consult for hx of Bipolar, Anxiety, Depression and THC use.  CSW met with MOB to offer support and complete assessment.   ? ?CSW met with MOB at bedside and introduced CSW role. CSW observed MOB in the bed on the phone and her support person at bedside holding the infant. CSW offered to return later. MOB presented pleasant and welcomed CSW to visit. MOB introduced her support as her sister Gail Robinson and gave CSW permission to share all information with her present. MOB did not want to share information about FOB. MOB provided an updated address and reported she will be moving to [place], [phone] in the coming days. MOB reported she lives with her son and daughter (see chart above). MOB reported she is currently unemployed and receives WIC/FS. MOB reported she has called both agencies and updated them about the birth.  ? ?CSW inquired how MOB has been feeling since giving birth. MOB reported that she has been feeling fine. CSW inquired about MOB mental health history. MOB reported that was diagnosed with anxiety, major  depression and Bipolar. MOB reported that she does not agree with the Bipolar diagnosis since it was given after a therapist at [facility] after they met one time early last year. MOB reported that she told the therapist about her family history with Bipolar and felt she diagnosed with Bipolar because of family history. MOB shared, ?everybody goes through hard times especially being a woman.? MOB explained that at least twice a year she a ?mental breakdown.? CSW asked MOB to elaborate. MOB reported during this time she may feel anxious, she does not want to be bothered with others, does not want to get out of bed but does because she prioritizes taking care of her children. MOB reflected on how her children are her motivation. MOB reported she was prescribed Abilify, Vistaril, Trazadone but feels  the medication is not helpful since she stills has depressive and anxiety symptoms. CSW encouraged MOB to try a different therapist and try a different combination of medication if that is what she desires to get better. MOB reported she prefers to see how she feels in the coming months and will then decide if she wants to follow up. CSW discussed PPD. MOB denied history of PPD. CSW provided education regarding the baby blues period vs. perinatal mood disorders, discussed treatment and gave resources for mental health follow up. CSW recommended MOB complete self-evaluation during the postpartum time period using the New Mom Checklist from Postpartum Progress and encouraged MOB to contact a medical professional if symptoms are noted at any time. CSW assessed MOB for safety. MOB denied thoughts of harm to self and others. MOB denied domestic violence concerns.   ? ?CSW inquired about MOB substance use during the pregnancy. MOB disclosed that she smoked marijuana during the pregnancy because the ?Flexeril prescribed for back pain was not working.? She also had issues with ?her appetite fluctuating? and ?acid reflux.? MOB reported the marijuana helped with her appetite and back pain. MOB reported the last time she used was about three weeks ago. CSW informed MOB about the hospital drug screen policy. MOB was made aware that CSW will monitor the infant's CDS/UDS and make a report to CPS, if warranted. CSW inquired if MOB has CPS history. MOB reported that about 5 years ago her son's father called CPS and made a report about marijuana use and concerns with her being an unfit parent. MOB expressed how verbally hurtful the child's father can be towards her which has been going on for years. MOB shared that her son's father is ?white and racist? and often refers to her and other relatives as the ?n-word.? MOB shared she tries to focus on raising her children and make the best life for them. MOB tearfully reflected on how she  cares for people and the love she gives is not always reciprocated. CSW provided active listening as MOB reflected on her emotions. CSW acknowledged MOB efforts and encouraged MOB to use the mental health resources provided. MOB reported none of the cases were substantiated and they have been all been closed. MOB reported she has not had any concerns with CPS since then. MOB reported she has custody of both of children.  ? ?MOB reported she has all items for the infant including a bassinet where the infant will sleep. ?CSW provided review of Sudden Infant Death Syndrome (SIDS) precautions.  MOB has chosen [facility] for the infant's follow up care and will have transportation to the appointments. CSW assessed MOB for  additional need. MOB reported no further needs.  ? ?CSW will continue to monitor the infant's UDS/CDS and make a report to CPS, if warranted.  ? ?CSW identifies no further need for intervention and no barriers to discharge at this time. ? ? ?CSW Plan/Description:  Sudden Infant Death Syndrome (SIDS) Education, CSW Will Continue to Monitor Umbilical Cord Tissue Drug Screen Results and Make Report if Warranted, Hospital Drug Screen Policy Information, Perinatal Mood and Anxiety Disorder (PMADs) Education, No Further Intervention Required/No Barriers to Discharge  ? ? ?Bently Morath A Vella Colquitt, LCSW ?10/04/2021, 4:21 PM ?

## 2021-10-04 NOTE — Progress Notes (Signed)
POSTPARTUM PROGRESS NOTE ? ?Post Partum Day 1 ? ?Subjective: ? ?Gail Robinson is a 31 y.o. [id number] s/p SVD at [redacted]w[redacted]d.  No acute events overnight.  Pt denies problems with ambulating, voiding or po intake.  She denies nausea or vomiting.  Pain is well controlled.  She has had flatus. She has not had bowel movement.  Lochia Small.  ? ?Objective: ?Blood pressure 112/63, pulse 86, temperature 98.2 ?F (36.8 ?C), temperature source Axillary, resp. rate 16, height 5\' 6"  (1.676 m), weight 109.3 kg, last menstrual period 12/06/2020, SpO2 100 %, unknown if currently breastfeeding. ? ?Physical Exam:  ?General: alert, cooperative and no distress ?Chest: no respiratory distress ?Heart:regular rate, distal pulses intact ?Abdomen: soft, nontender,  ?Uterine Fundus: firm, appropriately tender ?DVT Evaluation: No calf swelling or tenderness ?Extremities: no edema ?Skin: warm, dry ?Recent Labs  ?  10/03/21 ?2025  ?HGB 14.4  ?HCT 42.4  ? ? ?Assessment/Plan: ?JOEL MERICLE is a 31 y.o. 38 s/p SVD at [redacted]w[redacted]d  ? ?PPD#1 - Doing well ?Contraception: POPs ?Feeding: breast ?Dispo: Plan for discharge 4/27. ? ? LOS: 1 day  ? ?5/27, Medical Student ?10/04/2021, 6:53 AM  ? ?

## 2021-10-04 NOTE — Progress Notes (Signed)
Post Partum Day 1 ?Subjective: ?no complaints, up ad lib, voiding, and tolerating PO ? ?Objective: ?Blood pressure 112/63, pulse 86, temperature 98.2 ?F (36.8 ?C), temperature source Axillary, resp. rate 16, height 5\' 6"  (1.676 m), weight 109.3 kg, last menstrual period 12/06/2020, SpO2 100 %, unknown if currently breastfeeding. ? ?Physical Exam:  ?General: alert, cooperative, and no distress ?Lochia: appropriate ?Uterine Fundus: firm ?Incision: n/a ?DVT Evaluation: No evidence of DVT seen on physical exam. ? ?Recent Labs  ?  10/03/21 ?2025  ?HGB 14.4  ?HCT 42.4  ? ? ?Assessment/Plan: ?Plan for discharge tomorrow, Breastfeeding, and Lactation consult ? ? LOS: 1 day  ? ?Hansel Feinstein ?10/04/2021, 7:50 AM  ? ? ?

## 2021-10-04 NOTE — Lactation Note (Signed)
This note was copied from a baby's chart. ?Lactation Consultation Note ? ?Patient Name: Gail Robinson ?Today's Date: 10/04/2021 ?Reason for consult: Initial assessment;Term;Infant weight loss;Other (Comment) (P 3 , exp BF of 2, per mom baby just fed 40 mins with swallows, and comfort.) ?Age:32 hours baby lying in front of mom and baby wide awake and content.  ?Mom hand expressed drops.  ?Per mom doesn't have a pump yet and plans to call the insurance company.  ?LC provided the hand pump with instructions and checked the flange size.  ?Mom aware to call for Latch assessment.  ? ?Maternal Data ?Has patient been taught Hand Expression?: No (mom able to hand express and noted drops) ? ?Feeding ?Mother's Current Feeding Choice: Breast Milk ? ?LATCH Score ?  ? ?  ? ?  ? ?  ? ?  ? ?  ? ? ?Lactation Tools Discussed/Used ? Hand pump, Flange #24 and #27 F  ? ?Interventions ? Breast feeding basics , education, LC resources after D/C. Hand pump  ? ?Discharge ?WIC Program: Yes - [place]  ? ?Consult Status ?Consult Status: Follow-up ?Date: 10/04/21 ?Follow-up type: In-patient ? ? ? ?Matilde Sprang Aybree Lanyon ?10/04/2021, 9:00 AM ? ? ? ?

## 2021-10-04 NOTE — Anesthesia Postprocedure Evaluation (Signed)
Anesthesia Post Note ? ?Patient: KRISANNE LICH ? ?Procedure(s) Performed: AN AD HOC LABOR EPIDURAL ? ?  ? ?Patient location during evaluation: Mother Baby ?Anesthesia Type: Epidural ?Level of consciousness: awake, oriented and awake and alert ?Pain management: pain level controlled ?Vital Signs Assessment: post-procedure vital signs reviewed and stable ?Respiratory status: spontaneous breathing, respiratory function stable and nonlabored ventilation ?Cardiovascular status: stable ?Postop Assessment: adequate PO intake, able to ambulate, patient able to bend at knees, no headache, no apparent nausea or vomiting and no backache ?Anesthetic complications: no ? ? ?No notable events documented. ? ?Last Vitals:  ?Vitals:  ? 10/04/21 0608 10/04/21 1000  ?BP: 112/63 121/73  ?Pulse: 86 83  ?Resp: 16 18  ?Temp: 36.8 ?C 36.7 ?C  ?SpO2:  98%  ?  ?Last Pain:  ?Vitals:  ? 10/04/21 1000  ?TempSrc: Axillary  ?PainSc:   ? ?Pain Goal:   ? ?  ?  ?  ?  ?  ?  ?  ? ?Arieon Scalzo ? ? ? ? ?

## 2021-10-05 MED ORDER — IBUPROFEN 600 MG PO TABS: 600.0000 mg | ORAL_TABLET | Freq: Four times a day (QID) | ORAL | 0 refills | Status: DC

## 2021-10-05 NOTE — Social Work (Addendum)
Infant UDS came back positive for [facility], [facility] CPS made aware. No barriers to DC at this time. ?Carley Hammed MSW, LCSWA ?Float CSW ?

## 2021-10-05 NOTE — Lactation Note (Signed)
This note was copied from a baby's chart. ?Lactation Consultation Note ? ?Patient Name: Boy Sennie Borden ?Today's Date: 10/05/2021 ?Reason for consult: Follow-up assessment;Term ?Age:32 hours ? ?Mom affirmed that her nipples are lipstick-shaped when infant releases latch. Nipples are intact; Mom previously had nipple piercings. Mom reports that feedings are going well and that infant has fed q2h since MN. Mom reports that she saw infant spit up some white fluid earlier (suggesting milk transfer).  ? ?Mom is willing to call for me to return later to see if we can alter latch to prevent nipple shape distortion when infant releases latch.  ? ?Maternal Data ?Does the patient have breastfeeding experience prior to this delivery?: Yes ?How long did the patient breastfeed?: 1 yr ? ? ?Interventions ?Interventions: Education ? ? ?Lurline Hare McRae ?10/05/2021, 8:13 AM ? ? ? ?

## 2021-10-06 ENCOUNTER — Inpatient Hospital Stay (HOSPITAL_COMMUNITY): Admission: AD | Admit: 2021-10-06 | Payer: Medicaid Other | Source: Home / Self Care | Admitting: Family Medicine

## 2021-10-06 ENCOUNTER — Inpatient Hospital Stay (HOSPITAL_COMMUNITY): Payer: Medicaid Other

## 2021-10-07 ENCOUNTER — Encounter: Payer: Self-pay | Admitting: Radiology

## 2021-10-12 ENCOUNTER — Telehealth (HOSPITAL_COMMUNITY): Payer: Self-pay | Admitting: *Deleted

## 2021-10-12 NOTE — Telephone Encounter (Signed)
Hospital Discharge Follow-Up Call: ? ?Patient reports that she is doing OK.  She is having back pain that at times interferes with her ability to care for herself and her children.  She also reports having a headache for the last 3 days that does not go away with Tylenol or ibuprofen.  Denies fever, dizziness, blurred vision, or seeing spots.  Says she is in the [employer] and has a BP cuff at home.  Encouraged her to take her BP and enter the reading into [employer].  Also encouraged her to call her OB office for an appointment to further assess back pain and headache.  Advised that she could come to [facility] if her symptoms are worsening or she feels she needs to be seen urgently.  EPDS today was 1 and she endorses this accurately reflects that she is doing well emotionally. ? ?Patient reports that baby is well and she has no concerns about baby's health.  She says that baby sleeps in a beside bassinet.  Reviewed ABCs of Safe Sleep. ? ? ?

## 2021-10-16 ENCOUNTER — Encounter: Payer: Self-pay | Admitting: Radiology

## 2021-10-16 ENCOUNTER — Encounter: Payer: Self-pay | Admitting: Certified Nurse Midwife

## 2021-11-15 ENCOUNTER — Ambulatory Visit: Payer: Medicaid Other | Admitting: Certified Nurse Midwife

## 2021-11-17 ENCOUNTER — Encounter: Payer: Self-pay | Admitting: Radiology

## 2021-12-13 ENCOUNTER — Ambulatory Visit (INDEPENDENT_AMBULATORY_CARE_PROVIDER_SITE_OTHER): Payer: Medicaid Other | Admitting: Certified Nurse Midwife

## 2021-12-13 NOTE — Progress Notes (Deleted)
    Post Partum Visit Note  Gail Robinson is a 31 y.o. [phone] female who presents for a postpartum visit. She is 10 weeks 1 day postpartum following a normal spontaneous vaginal delivery.  I have fully reviewed the prenatal and intrapartum course. The delivery was at [redacted]w[redacted]d.  Anesthesia: epidural. Postpartum course has been ***. Baby is doing well***. Baby is feeding by {breastmilk/bottle:69}. Bleeding {vag bleed:12292}. Bowel function is {normal:32111}. Bladder function is {normal:32111}. Patient {is/is not:9024} sexually active. Contraception method is {contraceptive method:5051}. Postpartum depression screening: {gen negative/positive:315881}.   The pregnancy intention screening data noted above was reviewed. Potential methods of contraception were discussed. The patient elected to proceed with No data recorded.    There are no preventive care reminders to display for this patient.  {Common ambulatory SmartLinks:19316}  Review of Systems {ros; complete:30496}  Objective:  LMP 12/06/2020 (Approximate)    General:  {gen appearance:16600}   Breasts:  {desc; normal/abnormal/not indicated:14647}  Lungs: {lung exam:16931}  Heart:  {heart exam:5510}  Abdomen: {abdomen exam:16834}   Wound {Wound assessment:11097}  GU exam:  {desc; normal/abnormal/not indicated:14647}       Assessment:    1. Postpartum care and examination ***   *** postpartum exam.   Plan:   Essential components of care per ACOG recommendations:  1.  Mood and well being: Patient with {gen negative/positive:315881} depression screening today. Reviewed local resources for support.  - Patient tobacco use? {tobacco use:25506}  - hx of drug use? {yes/no:25505}    2. Infant care and feeding:  -Patient currently breastmilk feeding? {yes/no:25502}  -Social determinants of health (SDOH) reviewed in EPIC. No concerns***The following needs were identified***  3. Sexuality, contraception and birth spacing -  Patient {DOES_DOES UMP:53614} want a pregnancy in the next year.  Desired family size is {NUMBER 1-10:22536} children.  - Reviewed reproductive life planning. Reviewed contraceptive methods based on pt preferences and effectiveness.  Patient desired {Upstream End Methods:24109} today.   - Discussed birth spacing of 18 months  4. Sleep and fatigue -Encouraged family/partner/community support of 4 hrs of uninterrupted sleep to help with mood and fatigue  5. Physical Recovery  - Discussed patients delivery and complications. She describes her labor as {description:25511} - Patient had a {CHL AMB [phone]}. Patient had a {laceration:25518} laceration. Perineal healing reviewed. Patient expressed understanding - Patient has urinary incontinence? {yes/no:25515} - Patient {ACTION; IS/IS ERX:54008676} safe to resume physical and sexual activity  6.  Health Maintenance - HM due items addressed {Yes or If no, why not?:20788} - Last pap smear  Diagnosis  Date Value Ref Range Status  04/21/2021   Final   - Negative for intraepithelial lesion or malignancy (NILM)   Pap smear not done at today's visit.  -Breast Cancer screening indicated? {indicated:25516}  7. Chronic Disease/Pregnancy Condition follow up: {Follow up:25499}  - PCP follow up  Marjo Bicker, RN Center for [employer], [facility]

## 2021-12-13 NOTE — Progress Notes (Signed)
Pt canceled appt. 

## 2022-01-01 ENCOUNTER — Encounter: Payer: Self-pay | Admitting: Student

## 2022-01-01 ENCOUNTER — Ambulatory Visit (INDEPENDENT_AMBULATORY_CARE_PROVIDER_SITE_OTHER): Payer: Medicaid Other | Admitting: Student

## 2022-01-01 ENCOUNTER — Other Ambulatory Visit (HOSPITAL_COMMUNITY)
Admission: RE | Admit: 2022-01-01 | Discharge: 2022-01-01 | Disposition: A | Discharge status: Home / Self Care | Payer: Medicaid Other | Source: Ambulatory Visit | Attending: Certified Nurse Midwife | Admitting: Certified Nurse Midwife

## 2022-01-01 VITALS — BP 116/70 | HR 100 | Wt 230.0 lb

## 2022-01-01 DIAGNOSIS — Z113 Encounter for screening for infections with a predominantly sexual mode of transmission: Secondary | ICD-10-CM | POA: Diagnosis present

## 2022-01-01 DIAGNOSIS — Z3202 Encounter for pregnancy test, result negative: Secondary | ICD-10-CM

## 2022-01-01 DIAGNOSIS — F53 Postpartum depression: Secondary | ICD-10-CM

## 2022-01-01 DIAGNOSIS — Z30011 Encounter for initial prescription of contraceptive pills: Secondary | ICD-10-CM | POA: Diagnosis not present

## 2022-01-01 DIAGNOSIS — M545 Low back pain, unspecified: Secondary | ICD-10-CM | POA: Diagnosis not present

## 2022-01-01 LAB — POCT PREGNANCY, URINE: Preg Test, Ur: NEGATIVE

## 2022-01-01 MED ORDER — SERTRALINE HCL 50 MG PO TABS: 50.0000 mg | ORAL_TABLET | Freq: Every day | ORAL | 1 refills | Status: AC

## 2022-01-01 MED ORDER — NORETHINDRONE 0.35 MG PO TABS
1.0000 | ORAL_TABLET | Freq: Every day | ORAL | 11 refills | Status: AC
Start: 2022-01-01 — End: ?

## 2022-01-01 MED ORDER — CYCLOBENZAPRINE HCL 10 MG PO TABS: 10.0000 mg | ORAL_TABLET | Freq: Three times a day (TID) | ORAL | 0 refills | Status: DC | PRN

## 2022-01-01 NOTE — Progress Notes (Signed)
History:  Ms. GENEVIEVE ARBAUGH is a 31 y.o. [id number] who presents to clinic today for STI screening, groin/low back pain, and negative mood changes. Patient reports having unprotected sex with partner of unknown health status. Patient is now 3 months post vaginal delivery. Patient reports having constant back pain and difficulty with ambulation that started in pregnancy and has worsened postpartum. Patient has noticed herself crying more frequently and constantly having anxious thoughts. Patient states she feels "overwhelmed to be a single mom of 3." Patient denies any thoughts or ideas of harming herself or others.   The following portions of the patient's history were reviewed and updated as appropriate: allergies, current medications, family history, past medical history, social history, past surgical history and problem list.  Review of Systems:  Review of Systems  Constitutional:  Negative for chills, fever and weight loss.  Respiratory:  Negative for cough, shortness of breath and wheezing.   Cardiovascular:  Negative for chest pain and palpitations.  Gastrointestinal:  Negative for abdominal pain, constipation, diarrhea, nausea and vomiting.  Genitourinary:  Negative for dysuria, frequency and urgency.       Denies any vaginal discharge or irritation  Musculoskeletal:  Positive for back pain. Negative for falls.       Difficulty w/ ambulation, limited hip mobility, groin pain  Neurological:  Negative for weakness and headaches.  Psychiatric/Behavioral:  Positive for depression. Negative for substance abuse and suicidal ideas. The patient is nervous/anxious.       Objective:  Physical Exam BP 116/70   Pulse 100   Wt 230 lb (104.3 kg)   Breastfeeding Yes   BMI 37.12 kg/m  Physical Exam Constitutional:      Appearance: She is obese.  Cardiovascular:     Rate and Rhythm: Normal rate.  Pulmonary:     Effort: Pulmonary effort is normal.     Breath sounds: Normal breath  sounds.  Abdominal:     General: Bowel sounds are normal.     Palpations: Abdomen is soft.  Genitourinary:    Comments: Exam deferred; not indicated Musculoskeletal:        General: No swelling or signs of injury.  Neurological:     Mental Status: She is alert and oriented to person, place, and time.  Psychiatric:     Comments: Teary-eyed, anxious        Labs and Imaging Results for orders placed or performed in visit on 01/01/22 (from the past 24 hour(s))  Pregnancy, urine POC     Status: None   Collection Time: 01/01/22  3:23 PM  Result Value Ref Range   Preg Test, Ur NEGATIVE NEGATIVE    No results found.   Assessment & Plan:  1. Postpartum depression - Ambulatory referral to Integrated Behavioral Health - sertraline (ZOLOFT) 50 MG tablet; Take 1 tablet (50 mg total) by mouth daily.  Dispense: 30 tablet; Refill: 1  2. Screening for STD (sexually transmitted disease) - Cervicovaginal ancillary only( [place]) - HIV Antibody (routine testing w rflx) - RPR - Hepatitis C Antibody - Hepatitis B Surface AntiGEN  3. Acute bilateral low back pain without sciatica - Ambulatory referral to Physical Therapy - cyclobenzaprine (FLEXERIL) 10 MG tablet; Take 1 tablet (10 mg total) by mouth 3 (three) times daily as needed for muscle spasms.  Dispense: 15 tablet; Refill: 0  4. Initiation of oral contraception - Pregnancy, urine POC - norethindrone (MICRONOR) 0.35 MG tablet; Take 1 tablet (0.35 mg total) by mouth daily.  Dispense: 30  tablet; Refill: 11    Approximately 30 minutes of face-to-face time was spent with this patient   Plan for follow-up in 1 month for mood check.  Corlis Hove, NP 01/01/2022 5:11 PM

## 2022-01-01 NOTE — Progress Notes (Signed)
Patient complains of back where epidural was placed.  Ruud informs me that she feel as though she has been dealing with post partum depression. She stated that she has been crying everyday and feel as though everything "triggers and upsets" her and it ha gotten to the point where her daughter has seen her crying.   She feels as though it would be better for her to take additional time off from work to focus on ment health and would like FMLA time extended    [place] also wold like more information on increasing breast milk supply

## 2022-01-02 LAB — CERVICOVAGINAL ANCILLARY ONLY
Chlamydia: NEGATIVE
Comment: NEGATIVE
Comment: NORMAL
Neisseria Gonorrhea: NEGATIVE

## 2022-01-02 LAB — HIV ANTIBODY (ROUTINE TESTING W REFLEX): HIV Screen 4th Generation wRfx: NONREACTIVE

## 2022-01-02 LAB — HEPATITIS B SURFACE ANTIGEN: Hepatitis B Surface Ag: NEGATIVE

## 2022-01-02 LAB — RPR: RPR Ser Ql: NONREACTIVE

## 2022-01-02 LAB — HEPATITIS C ANTIBODY: Hep C Virus Ab: NONREACTIVE

## 2022-01-04 NOTE — BH Specialist Note (Unsigned)
Integrated Behavioral Health via Telemedicine Visit  01/18/2022 Gail Robinson [id number]  Number of Integrated Behavioral Health Clinician visits: 1- Initial Visit  Session Start time: 1320   Session End time: 1420  Total time in minutes: 60   Referring Provider: Corlis Hove, NP Patient/Family location: Home  [facility] Provider location: Center for [facility]  All persons participating in visit: Patient Gail Robinson and [facility] Gail Robinson   Types of Service: Individual psychotherapy and Video visit  I connected with Gail Robinson and/or Gail Robinson's  n/a  via  Telephone or Video Enabled Telemedicine Application  (Video is Caregility application) and verified that I am speaking with the correct person using two identifiers. Discussed confidentiality: Yes   I discussed the limitations of telemedicine and the availability of in person appointments.  Discussed there is a possibility of technology failure and discussed alternative modes of communication if that failure occurs.  I discussed that engaging in this telemedicine visit, they consent to the provision of behavioral healthcare and the services will be billed under their insurance.  Patient and/or legal guardian expressed understanding and consented to Telemedicine visit: Yes   Presenting Concerns: Patient and/or family reports the following symptoms/concerns: Fatigue, fidgety, anxiety, worry, irritability, attributes to adjusting to caring for three children alone and being a "people pleaser";  taking Zoloft as prescribed (sleepiness as side effect); implementing self-coping strategies daily to cope with emotions, including starting to set healthy boundaries. Duration of problem: Ongoing; Severity of problem: moderate  Patient and/or Family's Strengths/Protective Factors: Concrete supports in place (healthy food, safe environments, etc.), Sense of purpose,  and Physical Health (exercise, healthy diet, medication compliance, etc.)  Goals Addressed: Patient will:  Reduce symptoms of: anxiety, depression, and stress   Increase knowledge and/or ability of: stress reduction   Demonstrate ability to: Increase healthy adjustment to current life circumstances  Progress towards Goals: Ongoing  Interventions: Interventions utilized:  Solution-Focused Strategies and Supportive Reflection Standardized Assessments completed: GAD-7 and PHQ 9  Patient and/or Family Response: Patient agrees with treatment plan.   Assessment: Patient currently experiencing Adjustment disorder with mixed anxious and depressed mood and Psychosocial stress  Patient may benefit from psychoeducation and brief therapeutic interventions regarding coping with symptoms of anxiety, depression, life stress.  Plan: Follow up with behavioral health clinician on : Two weeks Behavioral recommendations:  -Continue taking Zoloft as prescribed -Continue using self-coping strategies that have remained helpful, daily as needed (mindfulness, healthy boundaries, etc) -Begin planning spa day for upcoming birthday, as discussed -Consider establishing with PCP of choice (possible list on After Visit Summary) Referral(s): Integrated [employer] (In Clinic)  I discussed the assessment and treatment plan with the patient and/or parent/guardian. They were provided an opportunity to ask questions and all were answered. They agreed with the plan and demonstrated an understanding of the instructions.   They were advised to call back or seek an in-person evaluation if the symptoms worsen or if the condition fails to improve as anticipated.  Valetta Close Gail Dave, LCSW     01/18/2022    1:41 PM 09/29/2021   12:04 PM 09/21/2021    2:25 PM 09/06/2021   10:32 AM 08/10/2021   10:45 AM  Depression screen PHQ 2/9  Decreased Interest 1 1 0 1 0  Down, Depressed, Hopeless 1 1 0 1 1  PHQ - 2  Score 2 2 0 2 1  Altered sleeping 0 0 1 0 2  Tired, decreased  energy 3 1 2 2 2   Change in appetite 2 0 0 1 1  Feeling bad or failure about yourself  1 0 0 0 0  Trouble concentrating 1 0 0 0 0  Moving slowly or fidgety/restless 3 0 0 0   Suicidal thoughts 0 0 0 0 0  PHQ-9 Score 12 3 3 5 6       01/18/2022    1:44 PM 09/29/2021   12:04 PM 09/21/2021    2:25 PM 09/06/2021   10:32 AM  GAD 7 : Generalized Anxiety Score  Nervous, Anxious, on Edge 3 1 2 1   Control/stop worrying 3 1 1 1   Worry too much - different things 3 2 2 1   Trouble relaxing 0 0 0 0  Restless 1 0 0 0  Easily annoyed or irritable 3 2 1 2   Afraid - awful might happen 0 0 0 0  Total GAD 7 Score 13 6 6  5

## 2022-01-05 ENCOUNTER — Encounter: Payer: Self-pay | Admitting: Radiology

## 2022-01-13 ENCOUNTER — Encounter: Payer: Self-pay | Admitting: Radiology

## 2022-01-18 ENCOUNTER — Ambulatory Visit: Payer: Medicaid Other | Admitting: Clinical

## 2022-01-18 DIAGNOSIS — Z658 Other specified problems related to psychosocial circumstances: Secondary | ICD-10-CM

## 2022-01-18 DIAGNOSIS — F4323 Adjustment disorder with mixed anxiety and depressed mood: Secondary | ICD-10-CM

## 2022-01-18 NOTE — Patient Instructions (Signed)
[facility] °[address] °[place], [place] [address] °[phone] (main office) °[phone] (Ewa Hipp's office) ° °[place] primary care offices possibly accepting new patients:  ° °[facility] [place] °[address] °[place], [place] [address] °[phone] ° °[facility] °[address] °[place], [place] [address] °[phone] ° °[facility] °[address] °[place], [place] [address] °[phone] ° °[facility] °[address] °[place], [place] [address] °[phone] ° °[facility] °[address] °[place],    [address] °[phone] ° °

## 2022-01-24 NOTE — BH Specialist Note (Signed)
Pt did not arrive to video visit and did not answer the phone; Left HIPPA-compliant message to call back Aya Geisel from [facility] at  [phone] (Savyon Loken's office).  ?; left MyChart message for patient.  ? ?

## 2022-02-04 ENCOUNTER — Encounter: Payer: Self-pay | Admitting: Radiology

## 2022-02-06 ENCOUNTER — Ambulatory Visit: Payer: Medicaid Other | Admitting: Clinical

## 2022-02-06 DIAGNOSIS — Z91199 Patient's noncompliance with other medical treatment and regimen due to unspecified reason: Secondary | ICD-10-CM

## 2022-06-07 ENCOUNTER — Telehealth: Payer: Medicaid Other | Admitting: Emergency Medicine

## 2022-06-07 DIAGNOSIS — R300 Dysuria: Secondary | ICD-10-CM | POA: Diagnosis not present

## 2022-06-07 MED ORDER — CEPHALEXIN 500 MG PO CAPS: 500.0000 mg | ORAL_CAPSULE | Freq: Two times a day (BID) | ORAL | 0 refills | Status: DC

## 2022-06-07 MED ORDER — FLUCONAZOLE 150 MG PO TABS: 150.0000 mg | ORAL_TABLET | Freq: Once | ORAL | 0 refills | Status: AC

## 2022-06-07 NOTE — Progress Notes (Signed)
E-Visit for Urinary Problems  We are sorry that you are not feeling well.  Here is how we plan to help!  Based on what you shared with me it looks like you most likely have a simple urinary tract infection.  A UTI (Urinary Tract Infection) is a bacterial infection of the bladder.  Most cases of urinary tract infections are simple to treat but a key part of your care is to encourage you to drink plenty of fluids and watch your symptoms carefully.  I have prescribed Keflex 500 mg twice a day for 7 days.  Your symptoms should gradually improve. Call us if the burning in your urine worsens, you develop worsening fever, back pain or pelvic pain or if your symptoms do not resolve after completing the antibiotic.  I've also sent a tablet of Diflucan in case you develop a yeast infection.  Urinary tract infections can be prevented by drinking plenty of water to keep your body hydrated.  Also be sure when you wipe, wipe from front to back and don't hold it in!  If possible, empty your bladder every 4 hours.  HOME CARE Drink plenty of fluids Compete the full course of the antibiotics even if the symptoms resolve Remember, when you need to go.go. Holding in your urine can increase the likelihood of getting a UTI! GET HELP RIGHT AWAY IF: You cannot urinate You get a high fever Worsening back pain occurs You see blood in your urine You feel sick to your stomach or throw up You feel like you are going to pass out  MAKE SURE YOU  Understand these instructions. Will watch your condition. Will get help right away if you are not doing well or get worse.   Thank you for choosing an e-visit.  Your e-visit answers were reviewed by a board certified advanced clinical practitioner to complete your personal care plan. Depending upon the condition, your plan could have included both over the counter or prescription medications.  Please review your pharmacy choice. Make sure the pharmacy is open so you  can pick up prescription now. If there is a problem, you may contact your provider through [employer] and have the prescription routed to another pharmacy.  Your safety is important to [place]. If you have drug allergies check your prescription carefully.   For the next 24 hours you can use MyChart to ask questions about today's visit, request a non-urgent call back, or ask for a work or school excuse. You will get an email in the next two days asking about your experience. I hope that your e-visit has been valuable and will speed your recovery.  Approximately 5 minutes was used in reviewing the patient's chart, questionnaire, prescribing medications, and documentation.

## 2022-06-19 ENCOUNTER — Telehealth: Payer: Self-pay | Admitting: Urgent Care

## 2022-06-19 DIAGNOSIS — B3731 Acute candidiasis of vulva and vagina: Secondary | ICD-10-CM

## 2022-06-19 DIAGNOSIS — N3000 Acute cystitis without hematuria: Secondary | ICD-10-CM

## 2022-06-19 MED ORDER — NITROFURANTOIN MONOHYD MACRO 100 MG PO CAPS: 100.0000 mg | ORAL_CAPSULE | Freq: Two times a day (BID) | ORAL | 0 refills | Status: AC

## 2022-06-19 MED ORDER — FLUCONAZOLE 150 MG PO TABS: 150.0000 mg | ORAL_TABLET | Freq: Every day | ORAL | 0 refills | Status: DC

## 2022-06-19 NOTE — Progress Notes (Signed)
E-Visit for Urinary Problems  We are sorry that you are not feeling well.  Here is how we plan to help!  Based on what you shared with me it looks like you most likely have a simple urinary tract infection.  A UTI (Urinary Tract Infection) is a bacterial infection of the bladder.  Most cases of urinary tract infections are simple to treat but a key part of your care is to encourage you to drink plenty of fluids and watch your symptoms carefully.  I have prescribed MacroBid 100 mg twice a day for 5 days.  Your symptoms should gradually improve. Call us if the burning in your urine worsens, you develop worsening fever, back pain or pelvic pain or if your symptoms do not resolve after completing the antibiotic.  Additionally, I have called in a medication for yeast. You will take one tablet in a single dose.  Urinary tract infections can be prevented by drinking plenty of water to keep your body hydrated.  Also be sure when you wipe, wipe from front to back and don't hold it in!  If possible, empty your bladder every 4 hours.  HOME CARE Drink plenty of fluids Compete the full course of the antibiotics even if the symptoms resolve Remember, when you need to go.go. Holding in your urine can increase the likelihood of getting a UTI! GET HELP RIGHT AWAY IF: You cannot urinate You get a high fever Worsening back pain occurs You see blood in your urine You feel sick to your stomach or throw up You feel like you are going to pass out  MAKE SURE YOU  Understand these instructions. Will watch your condition. Will get help right away if you are not doing well or get worse.   Thank you for choosing an e-visit.  Your e-visit answers were reviewed by a board certified advanced clinical practitioner to complete your personal care plan. Depending upon the condition, your plan could have included both over the counter or prescription medications.  Please review your pharmacy choice. Make sure the  pharmacy is open so you can pick up prescription now. If there is a problem, you may contact your provider through [employer] and have the prescription routed to another pharmacy.  Your safety is important to [place]. If you have drug allergies check your prescription carefully.   For the next 24 hours you can use MyChart to ask questions about today's visit, request a non-urgent call back, or ask for a work or school excuse. You will get an email in the next two days asking about your experience. I hope that your e-visit has been valuable and will speed your recovery.   I have spent 5 minutes in review of e-visit questionnaire, review and updating patient chart, medical decision making and response to patient.   [place], PA

## 2022-07-03 ENCOUNTER — Other Ambulatory Visit (HOSPITAL_BASED_OUTPATIENT_CLINIC_OR_DEPARTMENT_OTHER): Payer: Self-pay

## 2022-07-03 ENCOUNTER — Encounter (HOSPITAL_BASED_OUTPATIENT_CLINIC_OR_DEPARTMENT_OTHER): Payer: Self-pay | Admitting: Emergency Medicine

## 2022-07-03 ENCOUNTER — Other Ambulatory Visit: Payer: Self-pay

## 2022-07-03 ENCOUNTER — Emergency Department (HOSPITAL_BASED_OUTPATIENT_CLINIC_OR_DEPARTMENT_OTHER)
Admission: EM | Admit: 2022-07-03 | Discharge: 2022-07-03 | Disposition: A | Discharge status: Home / Self Care | Payer: Medicaid Other | Attending: Emergency Medicine | Admitting: Emergency Medicine

## 2022-07-03 DIAGNOSIS — N898 Other specified noninflammatory disorders of vagina: Secondary | ICD-10-CM | POA: Diagnosis present

## 2022-07-03 DIAGNOSIS — N739 Female pelvic inflammatory disease, unspecified: Secondary | ICD-10-CM | POA: Diagnosis not present

## 2022-07-03 DIAGNOSIS — J45909 Unspecified asthma, uncomplicated: Secondary | ICD-10-CM | POA: Diagnosis not present

## 2022-07-03 DIAGNOSIS — A599 Trichomoniasis, unspecified: Secondary | ICD-10-CM

## 2022-07-03 LAB — URINALYSIS, ROUTINE W REFLEX MICROSCOPIC
Bilirubin Urine: NEGATIVE
Glucose, UA: NEGATIVE mg/dL
Hgb urine dipstick: NEGATIVE
Ketones, ur: NEGATIVE mg/dL
Nitrite: NEGATIVE
Protein, ur: NEGATIVE mg/dL
Specific Gravity, Urine: 1.02 (ref 1.005–1.030)
pH: 8 (ref 5.0–8.0)

## 2022-07-03 LAB — WET PREP, GENITAL
Clue Cells Wet Prep HPF POC: NONE SEEN
Sperm: NONE SEEN
WBC, Wet Prep HPF POC: >=10 — AB (ref <10)
Yeast Wet Prep HPF POC: NONE SEEN

## 2022-07-03 LAB — URINALYSIS, MICROSCOPIC (REFLEX)

## 2022-07-03 LAB — PREGNANCY, URINE: Preg Test, Ur: NEGATIVE

## 2022-07-03 LAB — HIV ANTIBODY (ROUTINE TESTING W REFLEX): HIV Screen 4th Generation wRfx: NONREACTIVE

## 2022-07-03 MED ORDER — METRONIDAZOLE 500 MG PO TABS: 500.0000 mg | ORAL_TABLET | Freq: Two times a day (BID) | ORAL | 0 refills | Status: DC

## 2022-07-03 MED ORDER — CEFTRIAXONE SODIUM 500 MG IJ SOLR
500.0000 mg | Freq: Once | INTRAMUSCULAR | Status: AC
  Administered 2022-07-03: 500 mg via INTRAMUSCULAR
  Filled 2022-07-03: qty 500

## 2022-07-03 MED ORDER — METRONIDAZOLE 500 MG PO TABS
500.0000 mg | ORAL_TABLET | Freq: Two times a day (BID) | ORAL | 0 refills | Status: AC
  Filled 2022-07-03: qty 28, 14d supply, fill #0

## 2022-07-03 MED ORDER — DOXYCYCLINE HYCLATE 100 MG PO CAPS: 100.0000 mg | ORAL_CAPSULE | Freq: Two times a day (BID) | ORAL | 0 refills | Status: DC

## 2022-07-03 MED ORDER — FOSFOMYCIN TROMETHAMINE 3 G PO PACK
3.0000 g | PACK | Freq: Once | ORAL | Status: AC
  Administered 2022-07-03: 3 g via ORAL
  Filled 2022-07-03: qty 3

## 2022-07-03 MED ORDER — DOXYCYCLINE HYCLATE 100 MG PO CAPS
100.0000 mg | ORAL_CAPSULE | Freq: Two times a day (BID) | ORAL | 0 refills | Status: AC
  Filled 2022-07-03: qty 28, 14d supply, fill #0

## 2022-07-03 NOTE — ED Notes (Signed)
Pelvic cart at bedside. 

## 2022-07-03 NOTE — ED Provider Notes (Signed)
Gail Robinson   CSN: [id number] Arrival date & time: 07/03/22  7858     History {Add pertinent medical, surgical, social history, OB history to HPI:1} Chief Complaint  Patient presents with   Dysuria   Vaginal Discharge    Gail Robinson is a 32 y.o. female.  HPI      Vaginal discharge for 2-3 days Tightness lower abdomen off and on Home Medications Prior to Admission medications   Medication Sig Start Date End Date Taking? Authorizing Provider  cyclobenzaprine (FLEXERIL) 10 MG tablet Take 1 tablet (10 mg total) by mouth 3 (three) times daily as needed for muscle spasms. 01/01/22   Johnston Ebbs, NP  fluconazole (DIFLUCAN) 150 MG tablet Take 1 tablet (150 mg total) by mouth daily. 06/19/22   Crain, Whitney L, PA  norethindrone (MICRONOR) 0.35 MG tablet Take 1 tablet (0.35 mg total) by mouth daily. 01/01/22   Johnston Ebbs, NP  prenatal vitamin w/FE, FA (PRENATAL 1 + 1) 27-1 MG TABS tablet Take 1 tablet by mouth daily at 12 noon. Patient not taking: Reported on 01/01/2022 02/23/21   Virginia Rochester, NP  sertraline (ZOLOFT) 50 MG tablet Take 1 tablet (50 mg total) by mouth daily. 01/01/22   Johnston Ebbs, NP  beclomethasone (QVAR) 80 MCG/ACT inhaler Inhale 2 puffs into the lungs 2 (two) times daily. Patient not taking: Reported on 04/09/2018 11/12/13 12/22/19  Harden Mo, MD      Allergies    Patient has no known allergies.    Review of Systems   Review of Systems  Physical Exam Updated Vital Signs BP 121/88 (BP Location: Right Arm)   Pulse 79   Temp 99.3 F (37.4 C) (Oral)   Resp 17   Ht 5\' 6"  (1.676 m)   Wt 103.4 kg   LMP 06/12/2022 (Approximate)   SpO2 100%   BMI 36.80 kg/m  Physical Exam  ED Results / Procedures / Treatments   Labs (all labs ordered are listed, but only abnormal results are displayed) Labs Reviewed  URINALYSIS, ROUTINE W REFLEX MICROSCOPIC - Abnormal; Notable for the  following components:      Result Value   APPearance HAZY (*)    Leukocytes,Ua MODERATE (*)    All other components within normal limits  URINALYSIS, MICROSCOPIC (REFLEX) - Abnormal; Notable for the following components:   Bacteria, UA MANY (*)    All other components within normal limits  WET PREP, GENITAL  PREGNANCY, URINE  HIV ANTIBODY (ROUTINE TESTING W REFLEX)  GC/CHLAMYDIA PROBE AMP ([place]) NOT AT [facility]    EKG None  Radiology No results found.  Procedures Procedures  {Document cardiac monitor, telemetry assessment procedure when appropriate:1}  Medications Ordered in ED Medications - No data to display  ED Course/ Medical Decision Making/ A&P   {   Click here for ABCD2, HEART and other calculatorsREFRESH Robinson before signing :1}                          Medical Decision Making Amount and/or Complexity of Data Reviewed Labs: ordered.   ***  {Document critical care time when appropriate:1} {Document review of labs and clinical decision tools ie heart score, Chads2Vasc2 etc:1}  {Document your independent review of radiology images, and any outside records:1} {Document your discussion with family members, caretakers, and with consultants:1} {Document social determinants of health affecting pt's care:1} {Document your decision making why or why  not admission, treatments were needed:1} Final Clinical Impression(s) / ED Diagnoses Final diagnoses:  None    Rx / DC Orders ED Discharge Orders     None

## 2022-07-03 NOTE — ED Triage Notes (Signed)
Vaginal discharge , dysuria and lower abd pain. Unprotected sexual intercourse  4 days ago ,

## 2022-07-04 LAB — GC/CHLAMYDIA PROBE AMP (~~LOC~~) NOT AT ARMC
Chlamydia: NEGATIVE
Comment: NEGATIVE
Comment: NORMAL
Neisseria Gonorrhea: NEGATIVE

## 2022-07-17 ENCOUNTER — Encounter (HOSPITAL_BASED_OUTPATIENT_CLINIC_OR_DEPARTMENT_OTHER): Payer: Self-pay

## 2022-07-17 ENCOUNTER — Other Ambulatory Visit: Payer: Self-pay

## 2022-07-17 DIAGNOSIS — R102 Pelvic and perineal pain: Secondary | ICD-10-CM | POA: Diagnosis present

## 2022-07-17 DIAGNOSIS — J45909 Unspecified asthma, uncomplicated: Secondary | ICD-10-CM | POA: Diagnosis not present

## 2022-07-17 DIAGNOSIS — Z87891 Personal history of nicotine dependence: Secondary | ICD-10-CM | POA: Insufficient documentation

## 2022-07-17 LAB — URINALYSIS, ROUTINE W REFLEX MICROSCOPIC
Bilirubin Urine: NEGATIVE
Glucose, UA: NEGATIVE mg/dL
Hgb urine dipstick: NEGATIVE
Ketones, ur: NEGATIVE mg/dL
Nitrite: NEGATIVE
Protein, ur: NEGATIVE mg/dL
Specific Gravity, Urine: >=1.03 (ref 1.005–1.030)
pH: 6 (ref 5.0–8.0)

## 2022-07-17 LAB — COMPREHENSIVE METABOLIC PANEL
ALT: 15 U/L (ref 0–44)
AST: 17 U/L (ref 15–41)
Albumin: 3.9 g/dL (ref 3.5–5.0)
Alkaline Phosphatase: 86 U/L (ref 38–126)
Anion gap: 6 (ref 5–15)
BUN: 9 mg/dL (ref 6–20)
CO2: 24 mmol/L (ref 22–32)
Calcium: 8.3 mg/dL — ABNORMAL LOW (ref 8.9–10.3)
Chloride: 104 mmol/L (ref 98–111)
Creatinine, Ser: 0.71 mg/dL (ref 0.44–1.00)
GFR, Estimated: >60 mL/min (ref >60)
Glucose, Bld: 93 mg/dL (ref 70–99)
Potassium: 3.8 mmol/L (ref 3.5–5.1)
Sodium: 134 mmol/L — ABNORMAL LOW (ref 135–145)
Total Bilirubin: 0.7 mg/dL (ref 0.3–1.2)
Total Protein: 7.1 g/dL (ref 6.5–8.1)

## 2022-07-17 LAB — URINALYSIS, MICROSCOPIC (REFLEX)

## 2022-07-17 LAB — CBC
HCT: 41.5 % (ref 36.0–46.0)
Hemoglobin: 13.7 g/dL (ref 12.0–15.0)
MCH: 25.4 pg — ABNORMAL LOW (ref 26.0–34.0)
MCHC: 33 g/dL (ref 30.0–36.0)
MCV: 77 fL — ABNORMAL LOW (ref 80.0–100.0)
Platelets: 234 K/uL (ref 150–400)
RBC: 5.39 MIL/uL — ABNORMAL HIGH (ref 3.87–5.11)
RDW: 14.6 % (ref 11.5–15.5)
WBC: 9 K/uL (ref 4.0–10.5)
nRBC: 0 % (ref 0.0–0.2)

## 2022-07-17 LAB — LIPASE, BLOOD: Lipase: 29 U/L (ref 11–51)

## 2022-07-17 LAB — PREGNANCY, URINE: Preg Test, Ur: NEGATIVE

## 2022-07-17 MED ORDER — ACETAMINOPHEN 500 MG PO TABS
1000.0000 mg | ORAL_TABLET | Freq: Once | ORAL | Status: AC
  Administered 2022-07-17: 1000 mg via ORAL
  Filled 2022-07-17: qty 2
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
## 2022-07-18 ENCOUNTER — Emergency Department (HOSPITAL_BASED_OUTPATIENT_CLINIC_OR_DEPARTMENT_OTHER)
Admission: EM | Admit: 2022-07-18 | Discharge: 2022-07-18 | Disposition: A | Discharge status: Home / Self Care | Payer: Medicaid Other | Attending: Emergency Medicine | Admitting: Emergency Medicine

## 2022-07-18 ENCOUNTER — Emergency Department (HOSPITAL_BASED_OUTPATIENT_CLINIC_OR_DEPARTMENT_OTHER): Payer: Medicaid Other

## 2022-07-18 DIAGNOSIS — R102 Pelvic and perineal pain: Secondary | ICD-10-CM

## 2022-07-18 LAB — WET PREP, GENITAL
Clue Cells Wet Prep HPF POC: NONE SEEN
Sperm: NONE SEEN
Trich, Wet Prep: NONE SEEN
WBC, Wet Prep HPF POC: >=10 — AB (ref <10)
Yeast Wet Prep HPF POC: NONE SEEN

## 2022-07-18 MED ORDER — FLUCONAZOLE 150 MG PO TABS
150.0000 mg | ORAL_TABLET | Freq: Once | ORAL | Status: AC
  Administered 2022-07-18: 150 mg via ORAL
  Filled 2022-07-18: qty 1

## 2022-07-18 NOTE — ED Notes (Signed)
Back from ultrasound

## 2022-07-18 NOTE — ED Notes (Signed)
Patient is with ultrasound.
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
## 2022-07-18 NOTE — ED Provider Notes (Signed)
[facility] DEPT [facility] Provider Note: Gail Spurling, MD, FACEP  CSN: [id number] MRN: [id number] ARRIVAL: 07/17/22 at 2202 ROOM: [place]  STD Check   HISTORY OF PRESENT ILLNESS  07/18/22 12:28 AM Gail Robinson is a 32 y.o. female who has a history of trichomoniasis.  She states she received oral sex 2 days ago and now she feels stinging in her vulvovaginal area.  This was initiated by urination but has persisted.  She would like to be tested for STDs.  Her last menstrual period ended 4 days ago.  She has also been having some suprapubic discomfort which is not severe and has been readily relieved with Tylenol.   Past Medical History:  Diagnosis Date   Asthma    childhood   Heartburn    Lightheaded    Palpitations     Past Surgical History:  Procedure Laterality Date   NO PAST SURGERIES      Family History  Problem Relation Age of Onset   Healthy Mother    Healthy Father     Social History   Tobacco Use   Smoking status: Former    Packs/day: 0.30    Types: Cigarettes    Quit date: 12/23/2013    Years since quitting: 8.5   Smokeless tobacco: Never  Vaping Use   Vaping Use: Never used  Substance Use Topics   Alcohol use: Not Currently    Comment: 1 pint to 2 pints alcohol per week.    Drug use: Yes    Types: Marijuana    Comment: last used 07/2021    Prior to Admission medications   Medication Sig Start Date End Date Taking? Authorizing Provider  cyclobenzaprine (FLEXERIL) 10 MG tablet Take 1 tablet (10 mg total) by mouth 3 (three) times daily as needed for muscle spasms. 01/01/22   Gail Ebbs, NP  fluconazole (DIFLUCAN) 150 MG tablet Take 1 tablet (150 mg total) by mouth daily. 06/19/22   Gail Robinson, Gail L, PA  norethindrone (MICRONOR) 0.35 MG tablet Take 1 tablet (0.35 mg total) by mouth daily. 01/01/22   Gail Ebbs, NP  sertraline (ZOLOFT) 50 MG tablet Take 1 tablet (50 mg total) by mouth daily. 01/01/22   Gail Ebbs, NP   beclomethasone (QVAR) 80 MCG/ACT inhaler Inhale 2 puffs into the lungs 2 (two) times daily. Patient not taking: Reported on 04/09/2018 11/12/13 12/22/19  Gail Mo, MD    Allergies Patient has no known allergies.   REVIEW OF SYSTEMS  Negative except as noted here or in the History of Present Illness.   PHYSICAL EXAMINATION  Initial Vital Signs Blood pressure (!) 114/94, pulse 90, temperature 98.2 F (36.8 C), temperature source Oral, resp. rate 18, last menstrual period 07/13/2022, SpO2 99 %, currently breastfeeding.  Examination General: Well-developed, well-nourished female in no acute distress; appearance consistent with age of record HENT: normocephalic; atraumatic Eyes: Normal appearance Neck: supple Heart: regular rate and rhythm Lungs: clear to auscultation bilaterally Abdomen: soft; nondistended; nontender; bowel sounds present GU: White discharge on external genitalia; mucoid discharge per cervical os; no vaginal bleeding; mild cervical motion tenderness; mild right adnexal tenderness Extremities: No deformity; full range of motion; pulses normal Neurologic: Awake, alert and oriented; motor function intact in all extremities and symmetric; no facial droop Skin: Warm and dry Psychiatric: Normal mood and affect   RESULTS  Summary of this visit's results, reviewed and interpreted by myself:   EKG Interpretation  Date/Time:    Ventricular Rate:  PR Interval:    QRS Duration:   QT Interval:    QTC Calculation:   R Axis:     Text Interpretation:         Laboratory Studies: Results for orders placed or performed during the hospital encounter of 07/18/22 (from the past 24 hour(s))  Lipase, blood     Status: None   Collection Time: 07/17/22 10:15 PM  Result Value Ref Range   Lipase 29 11 - 51 U/Robinson  Comprehensive metabolic panel     Status: Abnormal   Collection Time: 07/17/22 10:15 PM  Result Value Ref Range   Sodium 134 (Robinson) 135 - 145 mmol/Robinson    Potassium 3.8 3.5 - 5.1 mmol/Robinson   Chloride 104 98 - 111 mmol/Robinson   CO2 24 22 - 32 mmol/Robinson   Glucose, Bld 93 70 - 99 mg/dL   BUN 9 6 - 20 mg/dL   Creatinine, Ser 0.71 0.44 - 1.00 mg/dL   Calcium 8.3 (Robinson) 8.9 - 10.3 mg/dL   Total Protein 7.1 6.5 - 8.1 g/dL   Albumin 3.9 3.5 - 5.0 g/dL   AST 17 15 - 41 U/Robinson   ALT 15 0 - 44 U/Robinson   Alkaline Phosphatase 86 38 - 126 U/Robinson   Total Bilirubin 0.7 0.3 - 1.2 mg/dL   GFR, Estimated >60 >60 mL/min   Anion gap 6 5 - 15  CBC     Status: Abnormal   Collection Time: 07/17/22 10:15 PM  Result Value Ref Range   WBC 9.0 4.0 - 10.5 K/uL   RBC 5.39 (H) 3.87 - 5.11 MIL/uL   Hemoglobin 13.7 12.0 - 15.0 g/dL   HCT 41.5 36.0 - 46.0 %   MCV 77.0 (Robinson) 80.0 - 100.0 fL   MCH 25.4 (Robinson) 26.0 - 34.0 pg   MCHC 33.0 30.0 - 36.0 g/dL   RDW 14.6 11.5 - 15.5 %   Platelets 234 150 - 400 K/uL   nRBC 0.0 0.0 - 0.2 %  Urinalysis, Routine w reflex microscopic -Urine, Clean Catch     Status: Abnormal   Collection Time: 07/17/22 10:15 PM  Result Value Ref Range   Color, Urine YELLOW YELLOW   APPearance CLEAR CLEAR   Specific Gravity, Urine >=1.030 1.005 - 1.030   pH 6.0 5.0 - 8.0   Glucose, UA NEGATIVE NEGATIVE mg/dL   Hgb urine dipstick NEGATIVE NEGATIVE   Bilirubin Urine NEGATIVE NEGATIVE   Ketones, ur NEGATIVE NEGATIVE mg/dL   Protein, ur NEGATIVE NEGATIVE mg/dL   Nitrite NEGATIVE NEGATIVE   Leukocytes,Ua TRACE (A) NEGATIVE  Pregnancy, urine     Status: None   Collection Time: 07/17/22 10:15 PM  Result Value Ref Range   Preg Test, Ur NEGATIVE NEGATIVE  Urinalysis, Microscopic (reflex)     Status: Abnormal   Collection Time: 07/17/22 10:15 PM  Result Value Ref Range   RBC / HPF 0-5 0 - 5 RBC/hpf   WBC, UA 6-10 0 - 5 WBC/hpf   Bacteria, UA FEW (A) NONE SEEN   Squamous Epithelial / HPF 0-5 0 - 5 /HPF   Mucus PRESENT   Wet prep, genital     Status: Abnormal   Collection Time: 07/18/22 12:35 AM   Specimen: Vaginal  Result Value Ref Range   Yeast Wet Prep HPF POC NONE  SEEN NONE SEEN   Trich, Wet Prep NONE SEEN NONE SEEN   Clue Cells Wet Prep HPF POC NONE SEEN NONE SEEN   WBC, Wet Prep HPF POC >=  10 (A) <10   Sperm NONE SEEN    Imaging Studies: US PELVIC COMPLETE WITH TRANSVAGINAL  Result Date: 07/18/2022 CLINICAL DATA:  Diffuse pelvic pain. EXAM: TRANSABDOMINAL AND TRANSVAGINAL ULTRASOUND OF PELVIS TECHNIQUE: Both transabdominal and transvaginal ultrasound examinations of the pelvis were performed. Transabdominal technique was performed for global imaging of the pelvis including uterus, ovaries, adnexal regions, and pelvic cul-de-sac. It was necessary to proceed with endovaginal exam following the transabdominal exam to visualize the uterus, endometrium, bilateral ovaries and bilateral adnexa. COMPARISON:  None Available. FINDINGS: Uterus Measurements: 10.1 cm x 6.0 cm x 6.8 cm = volume: 216.5 mL. No fibroids or other mass visualized. Endometrium Thickness: 14.8 mm.  No focal abnormality visualized. Right ovary Measurements: 3.5 cm x 1.7 cm x 1.7 cm = volume: 5.3 mL. Normal appearance/no adnexal mass. Left ovary Measurements: 4.4 cm x 1.6 cm x 2.0 cm = volume: 7.5 mL. Normal appearance/no adnexal mass. Other findings No abnormal free fluid. IMPRESSION: Unremarkable pelvic ultrasound. Electronically Signed   By: Virgina Norfolk M.D.   On: 07/18/2022 01:49    ED COURSE and MDM  Nursing notes, initial and subsequent vitals signs, including pulse oximetry, reviewed and interpreted by myself.  Vitals:   07/17/22 2213  BP: (!) 114/94  Pulse: 90  Resp: 18  Temp: 98.2 F (36.8 C)  TempSrc: Oral  SpO2: 99%   Medications  acetaminophen (TYLENOL) tablet 1,000 mg (1,000 mg Oral Given 07/17/22 2220)  fluconazole (DIFLUCAN) tablet 150 mg (150 mg Oral Given 07/18/22 0139)   Although the patient's wet prep is negative the white discharge seen on the vulva is concerning for a yeast infection and yeast does not always show on a wet prep.  We will go ahead and treat with  Diflucan.  Gonorrhea and Chlamydia testing pending.  2:04 AM The patient's ultrasound is unremarkable and her degree of pain is not concerning for PID or other significant pathology.  As noted gonorrhea and Chlamydia testing are pending.   PROCEDURES  Procedures   ED DIAGNOSES     ICD-10-CM   1. Vulvovaginal discomfort  R10.2          Reshunda Strider, MD 07/18/22 2725

## 2022-07-19 LAB — GC/CHLAMYDIA PROBE AMP (~~LOC~~) NOT AT ARMC
Chlamydia: NEGATIVE
Comment: NEGATIVE
Comment: NORMAL
Neisseria Gonorrhea: NEGATIVE

## 2022-08-08 ENCOUNTER — Ambulatory Visit: Payer: Medicaid Other | Admitting: Medical

## 2022-12-16 ENCOUNTER — Telehealth: Payer: Medicaid Other | Admitting: Family

## 2022-12-16 DIAGNOSIS — R399 Unspecified symptoms and signs involving the genitourinary system: Secondary | ICD-10-CM

## 2022-12-16 DIAGNOSIS — H00012 Hordeolum externum right lower eyelid: Secondary | ICD-10-CM

## 2022-12-16 MED ORDER — BACITRACIN-POLYMYXIN B 500-10000 UNIT/GM OP OINT: 1.0000 | TOPICAL_OINTMENT | Freq: Two times a day (BID) | OPHTHALMIC | 0 refills | Status: DC

## 2022-12-16 MED ORDER — CEPHALEXIN 500 MG PO CAPS: 500.0000 mg | ORAL_CAPSULE | Freq: Two times a day (BID) | ORAL | 0 refills | Status: DC

## 2022-12-16 NOTE — Progress Notes (Signed)
  E-Visit for Stye   We are sorry that you are not feeling well. Here is how we plan to help!  Based on what you have shared with me it looks like you have a stye.  A stye is an inflammation of the eyelid.  It is often a red, painful lump near the edge of the eyelid that may look like a boil or a pimple.  A stye develops when an infection occurs at the base of an eyelash.   We have made appropriate suggestions for you based upon your presentation:   The use of anti-inflammatory and antibiotic eye ointment, polysporin.  If your symptoms do not improve over the next two to three days you should be seen in your doctor's office.  HOME CARE:  Wash your hands often! Let the stye open on its own. Don't squeeze or open it. Don't rub your eyes. This can irritate your eyes and let in bacteria.  If you need to touch your eyes, wash your hands first. Don't wear eye makeup or contact lenses until the area has healed.  GET HELP RIGHT AWAY IF:  Your symptoms do not improve. You develop blurred or loss of vision. Your symptoms worsen (increased discharge, pain or redness).   Thank you for choosing an e-visit.  Your e-visit answers were reviewed by a board certified advanced clinical practitioner to complete your personal care plan. Depending upon the condition, your plan could have included both over the counter or prescription medications.  Please review your pharmacy choice. Make sure the pharmacy is open so you can pick up prescription now. If there is a problem, you may contact your provider through [employer] and have the prescription routed to another pharmacy.  Your safety is important to [place]. If you have drug allergies check your prescription carefully.   For the next 24 hours you can use MyChart to ask questions about today's visit, request a non-urgent call back, or ask for a work or school excuse. You will get an email in the next two days asking about your experience. I hope that  your e-visit has been valuable and will speed your recovery.   Approximately 5 minutes was spent documenting and reviewing patient's chart.

## 2022-12-16 NOTE — Progress Notes (Signed)

## 2022-12-19 ENCOUNTER — Telehealth: Payer: Medicaid Other | Admitting: Nurse Practitioner

## 2022-12-19 DIAGNOSIS — J069 Acute upper respiratory infection, unspecified: Secondary | ICD-10-CM

## 2022-12-19 MED ORDER — IPRATROPIUM BROMIDE 0.03 % NA SOLN: 2.0000 | Freq: Two times a day (BID) | NASAL | 12 refills | Status: DC

## 2022-12-19 NOTE — Progress Notes (Signed)
E-Visit for Sore Throat  We are sorry that you are not feeling well.  Here is how we plan to help!  Your symptoms indicate a likely viral infection (Pharyngitis).   Pharyngitis is inflammation in the back of the throat which can cause a sore throat, scratchiness and sometimes difficulty swallowing.   Pharyngitis is typically caused by a respiratory virus and will just run its course.  Please keep in mind that your symptoms could last up to 10 days.  For throat pain, we recommend over the counter oral pain relief medications such as acetaminophen or aspirin, or anti-inflammatory medications such as ibuprofen or naproxen sodium.  Topical treatments such as oral throat lozenges or sprays may be used as needed.  Avoid close contact with loved ones, especially the very young and elderly.  Remember to wash your hands thoroughly throughout the day as this is the number one way to prevent the spread of infection and wipe down door knobs and counters with disinfectant.  After careful review of your answers, I would not recommend an antibiotic for your condition.  Antibiotics should not be used to treat conditions that we suspect are caused by viruses like the virus that causes the common cold or flu. However, some people can have Strep with atypical symptoms. You may need formal testing in clinic or office to confirm if your symptoms continue or worsen.  Providers prescribe antibiotics to treat infections caused by bacteria. Antibiotics are very powerful in treating bacterial infections when they are used properly.  To maintain their effectiveness, they should be used only when necessary.  Overuse of antibiotics has resulted in the development of super bugs that are resistant to treatment!    E-Visit for Upper Respiratory Infection   We are sorry you are not feeling well.  Here is how we plan to help!  Based on what you have shared with me, it looks like you may have a viral upper respiratory infection.   Upper respiratory infections are caused by a large number of viruses; however, rhinovirus is the most common cause.   Symptoms vary from person to person, with common symptoms including sore throat, cough, fatigue or lack of energy and feeling of general discomfort.  A low-grade fever of up to 100.4 may present, but is often uncommon.  Symptoms vary however, and are closely related to a person's age or underlying illnesses.  The most common symptoms associated with an upper respiratory infection are nasal discharge or congestion, cough, sneezing, headache and pressure in the ears and face.  These symptoms usually persist for about 3 to 10 days, but can last up to 2 weeks.  It is important to know that upper respiratory infections do not cause serious illness or complications in most cases.    Upper respiratory infections can be transmitted from person to person, with the most common method of transmission being a person's hands.  The virus is able to live on the skin and can infect other persons for up to 2 hours after direct contact.  Also, these can be transmitted when someone coughs or sneezes; thus, it is important to cover the mouth to reduce this risk.  To keep the spread of the illness at bay, good hand hygiene is very important.  This is an infection that is most likely caused by a virus. There are no specific treatments other than to help you with the symptoms until the infection runs its course.  We are sorry you are not feeling  well.  Here is how we plan to help!   For nasal congestion, you may use an oral decongestants such as Mucinex D or if you have glaucoma or high blood pressure use plain Mucinex.  Saline nasal spray or nasal drops can help and can safely be used as often as needed for congestion.  For your congestion, I have prescribed Ipratropium Bromide nasal spray 0.03% two sprays in each nostril 2-3 times a day  If you do not have a history of heart disease, hypertension, diabetes or  thyroid disease, prostate/bladder issues or glaucoma, you may also use Sudafed to treat nasal congestion.  It is highly recommended that you consult with a pharmacist or your primary care physician to ensure this medication is safe for you to take.     If you have a cough, you may use cough suppressants such as Delsym and Robitussin.    If you have a sore or scratchy throat, use a saltwater gargle-  to  teaspoon of salt dissolved in a 4-ounce to 8-ounce glass of warm water.  Gargle the solution for approximately 15-30 seconds and then spit.  It is important not to swallow the solution.  You can also use throat lozenges/cough drops and Chloraseptic spray to help with throat pain or discomfort.  Warm or cold liquids can also be helpful in relieving throat pain.  For headache, pain or general discomfort, you can use Ibuprofen or Tylenol as directed.   Some authorities believe that zinc sprays or the use of Echinacea may shorten the course of your symptoms.   HOME CARE Only take medications as instructed by your medical team. Be sure to drink plenty of fluids. Water is fine as well as fruit juices, sodas and electrolyte beverages. You may want to stay away from caffeine or alcohol. If you are nauseated, try taking small sips of liquids. How do you know if you are getting enough fluid? Your urine should be a pale yellow or almost colorless. Get rest. Taking a steamy shower or using a humidifier may help nasal congestion and ease sore throat pain. You can place a towel over your head and breathe in the steam from hot water coming from a faucet. Using a saline nasal spray works much the same way. Cough drops, hard candies and sore throat lozenges may ease your cough. Avoid close contacts especially the very young and the elderly Cover your mouth if you cough or sneeze Always remember to wash your hands.   GET HELP RIGHT AWAY IF: You develop worsening fever. If your symptoms do not improve within 10  days You develop yellow or green discharge from your nose over 3 days. You have coughing fits You develop a severe head ache or visual changes. You develop shortness of breath, difficulty breathing or start having chest pain Your symptoms persist after you have completed your treatment plan  MAKE SURE YOU  Understand these instructions. Will watch your condition. Will get help right away if you are not doing well or get worse.  Thank you for choosing an e-visit.  Your e-visit answers were reviewed by a board certified advanced clinical practitioner to complete your personal care plan. Depending upon the condition, your plan could have included both over the counter or prescription medications.  Please review your pharmacy choice. Make sure the pharmacy is open so you can pick up prescription now. If there is a problem, you may contact your provider through MyChart messaging and have the prescription routed to  another pharmacy.  Your safety is important to [place]. If you have drug allergies check your prescription carefully.   For the next 24 hours you can use MyChart to ask questions about today's visit, request a non-urgent call back, or ask for a work or school excuse. You will get an email in the next two days asking about your experience. I hope that your e-visit has been valuable and will speed your recovery.  Meds ordered this encounter  Medications   ipratropium (ATROVENT) 0.03 % nasal spray    Sig: Place 2 sprays into both nostrils every 12 (twelve) hours.    Dispense:  30 mL    Refill:  12    I spent approximately 5 minutes reviewing the patient's history, current symptoms and coordinating their care today.

## 2023-01-27 ENCOUNTER — Telehealth: Payer: Medicaid Other | Admitting: Nurse Practitioner

## 2023-01-27 DIAGNOSIS — N760 Acute vaginitis: Secondary | ICD-10-CM | POA: Diagnosis not present

## 2023-01-27 DIAGNOSIS — R399 Unspecified symptoms and signs involving the genitourinary system: Secondary | ICD-10-CM

## 2023-01-27 MED ORDER — CEPHALEXIN 500 MG PO CAPS: 500.0000 mg | ORAL_CAPSULE | Freq: Two times a day (BID) | ORAL | 0 refills | Status: DC

## 2023-01-27 MED ORDER — METRONIDAZOLE 500 MG PO TABS: 500.0000 mg | ORAL_TABLET | Freq: Two times a day (BID) | ORAL | 0 refills | Status: AC

## 2023-01-27 NOTE — Addendum Note (Signed)
Addended by: Bertram Denver on: 01/27/2023 10:12 AM   Modules accepted: Orders

## 2023-01-27 NOTE — Progress Notes (Signed)

## 2023-01-27 NOTE — Progress Notes (Signed)
I have spent 5 minutes in review of e-visit questionnaire, review and updating patient chart, medical decision making and response to patient.  ° °Zelda W Fleming, NP ° °  °

## 2023-05-07 ENCOUNTER — Telehealth: Payer: Medicaid Other | Admitting: Physician Assistant

## 2023-05-07 DIAGNOSIS — R109 Unspecified abdominal pain: Secondary | ICD-10-CM

## 2023-05-07 DIAGNOSIS — N898 Other specified noninflammatory disorders of vagina: Secondary | ICD-10-CM

## 2023-05-07 DIAGNOSIS — R300 Dysuria: Secondary | ICD-10-CM

## 2023-05-07 NOTE — Progress Notes (Signed)
Because of need to differentiate bladder infection from vaginitis which involves exam and urine testing/vaginal swabbing, I feel your condition warrants further evaluation and I recommend that you be seen in a face to face visit.   NOTE: There will be NO CHARGE for this eVisit   If you are having a true medical emergency please call 911.      For an urgent face to face visit, [place] has eight urgent care centers for your convenience:   NEW!! [facility] at [facility] Get Driving Directions [phone] [address] [place], [address]    [facility] at [facility] Get Driving Directions [phone] [address] [place], [place] [address]   [facility] Urgent Care Center [facility]) Get Driving Directions [phone] [address] [place], [place] [address]  [facility] Urgent Care Center [facility]) Get Driving Directions [phone] [address] [place],  [place]  [address]  [facility] Urgent [facility] Center [facility] - at [employer]  [phone] [address] [place],  [place] [address]   [facility] Urgent Care at [facility] Get Driving Directions [phone] [address] [place], [place] [address]   [facility] Urgent Care at [facility] Get Driving Directions  [phone] [address] [place], [place] [address]   [facility] Urgent Care at [facility] Directions [phone] [address], [address] [place], [place] [address]  Your MyChart E-visit questionnaire answers were reviewed by a board certified advanced clinical practitioner to complete your personal care plan based on your specific symptoms.  Thank you for using e-Visits.

## 2023-05-07 NOTE — Progress Notes (Signed)
Message sent to patient requesting further input regarding current symptoms. Awaiting patient response.  

## 2023-05-08 ENCOUNTER — Telehealth: Payer: Medicaid Other | Admitting: Physician Assistant

## 2023-05-08 DIAGNOSIS — B9689 Other specified bacterial agents as the cause of diseases classified elsewhere: Secondary | ICD-10-CM | POA: Diagnosis not present

## 2023-05-08 DIAGNOSIS — N760 Acute vaginitis: Secondary | ICD-10-CM

## 2023-05-08 DIAGNOSIS — N898 Other specified noninflammatory disorders of vagina: Secondary | ICD-10-CM

## 2023-05-08 DIAGNOSIS — R300 Dysuria: Secondary | ICD-10-CM

## 2023-05-08 DIAGNOSIS — R3989 Other symptoms and signs involving the genitourinary system: Secondary | ICD-10-CM | POA: Diagnosis not present

## 2023-05-08 MED ORDER — METRONIDAZOLE 500 MG PO TABS: 500.0000 mg | ORAL_TABLET | Freq: Two times a day (BID) | ORAL | 0 refills | Status: AC

## 2023-05-08 MED ORDER — CEPHALEXIN 500 MG PO CAPS: 500.0000 mg | ORAL_CAPSULE | Freq: Two times a day (BID) | ORAL | 0 refills | Status: AC

## 2023-05-08 NOTE — Patient Instructions (Signed)
[name], thank you for joining Piedad Climes, PA-C for today's virtual visit.  While this provider is not your primary care provider (PCP), if your PCP is located in our provider database this encounter information will be shared with them immediately following your visit.   A [facility] MyChart account gives you access to today's visit and all your visits, tests, and labs performed at [facility] " click here if you don't have a [facility] MyChart account or go to [email or web address]  Consent: (Patient) MILENKA SUNADA provided verbal consent for this virtual visit at the beginning of the encounter.  Current Medications:  Current Outpatient Medications:    bacitracin-polymyxin b (POLYSPORIN) ophthalmic ointment, Place 1 Application into the right eye 2 (two) times daily. apply to eye every 12 hours while awake, Disp: 3.5 g, Rfl: 0   cyclobenzaprine (FLEXERIL) 10 MG tablet, Take 1 tablet (10 mg total) by mouth 3 (three) times daily as needed for muscle spasms., Disp: 15 tablet, Rfl: 0   ipratropium (ATROVENT) 0.03 % nasal spray, Place 2 sprays into both nostrils every 12 (twelve) hours., Disp: 30 mL, Rfl: 12   norethindrone (MICRONOR) 0.35 MG tablet, Take 1 tablet (0.35 mg total) by mouth daily., Disp: 30 tablet, Rfl: 11   sertraline (ZOLOFT) 50 MG tablet, Take 1 tablet (50 mg total) by mouth daily., Disp: 30 tablet, Rfl: 1   Medications ordered in this encounter:  No orders of the defined types were placed in this encounter.    *If you need refills on other medications prior to your next appointment, please contact your pharmacy*  Follow-Up: Call back or seek an in-person evaluation if the symptoms worsen or if the condition fails to improve as anticipated.  [facility] [phone]  Other Instructions Your symptoms are consistent with a bladder infection, also called acute cystitis. Please take your antibiotic (Keflex) as  directed until all pills are gone.  Stay very well hydrated.  Consider a daily probiotic (Align, Culturelle, or Activia) to help prevent stomach upset caused by the antibiotic.  Taking a probiotic daily may also help prevent recurrent UTIs.  Also consider taking AZO (Phenazopyridine) tablets to help decrease pain with urination.    Urinary Tract Infection A urinary tract infection (UTI) can occur any place along the urinary tract. The tract includes the kidneys, ureters, bladder, and urethra. A type of germ called bacteria often causes a UTI. UTIs are often helped with antibiotic medicine.  HOME CARE  If given, take antibiotics as told by your doctor. Finish them even if you start to feel better. Drink enough fluids to keep your pee (urine) clear or pale yellow. Avoid tea, drinks with caffeine, and bubbly (carbonated) drinks. Pee often. Avoid holding your pee in for a long time. Pee before and after having sex (intercourse). Wipe from front to back after you poop (bowel movement) if you are a woman. Use each tissue only once. GET HELP RIGHT AWAY IF:  You have back pain. You have lower belly (abdominal) pain. You have chills. You feel sick to your stomach (nauseous). You throw up (vomit). Your burning or discomfort with peeing does not go away. You have a fever. Your symptoms are not better in 3 days. MAKE SURE YOU:  Understand these instructions. Will watch your condition. Will get help right away if you are not doing well or get worse. Document Released: 11/14/2007 Document Revised: 02/20/2012 Document Reviewed: 12/27/2011 [facility] Patient Information 2015 [place], [place]. This  information is not intended to replace advice given to you by your health care provider. Make sure you discuss any questions you have with your health care provider.   Bacterial Vaginosis  Bacterial vaginosis is an infection of the vagina. It happens when too many normal germs (healthy bacteria) grow in the  vagina. This infection can make it easier to get other infections from sex (STIs). It is very important for pregnant women to get treated. This infection can cause babies to be born early or at a low birth weight. What are the causes? This infection is caused by an increase in certain germs that grow in the vagina. You cannot get this infection from toilet seats, bedsheets, swimming pools, or things that touch your vagina. What increases the risk? Having sex with a new person or more than one person. Having sex without protection. Douching. Having an intrauterine device (IUD). Smoking. Using drugs or drinking alcohol. These can lead you to do things that are risky. Taking certain antibiotic medicines. Being pregnant. What are the signs or symptoms? Some women have no symptoms. Symptoms may include: A discharge from your vagina. It may be gray or white. It can be watery or foamy. A fishy smell. This can happen after sex or during your menstrual period. Itching in and around your vagina. A feeling of burning or pain when you pee (urinate). How is this treated? This infection is treated with antibiotic medicines. These may be given to you as: A pill. A cream for your vagina. A medicine that you put into your vagina (suppository). If the infection comes back after treatment, you may need more antibiotics. Follow these instructions at home: Medicines Take over-the-counter and prescription medicines as told by your doctor. Take or use your antibiotic medicine as told by your doctor. Do not stop taking or using it, even if you start to feel better. General instructions If the person you have sex with is a woman, tell her that you have this infection. She will need to follow up with her doctor. If you have a female partner, he does not need to be treated. Do not have sex until you finish treatment. Drink enough fluid to keep your pee pale yellow. Keep your vagina and butt clean. Wash the  area with warm water each day. Wipe from front to back after you use the toilet. If you are breastfeeding a baby, ask your doctor if you should keep doing so during treatment. Keep all follow-up visits. How is this prevented? Self-care Do not douche. Use only warm water to wash around your vagina. Wear underwear that is cotton or lined with cotton. Do not wear tight pants and pantyhose, especially in the summer. Safe sex Use protection when you have sex. This includes: Use condoms. Use dental dams. This is a thin layer that protects the mouth during oral sex. Limit how many people you have sex with. To prevent this infection, it is best to have sex with just one person. Get tested for STIs. The person you have sex with should also get tested. Drugs and alcohol Do not smoke or use any products that contain nicotine or tobacco. If you need help quitting, ask your doctor. Do not use drugs. Do not drink alcohol if: Your doctor tells you not to drink. You are pregnant, may be pregnant, or are planning to become pregnant. If you drink alcohol: Limit how much you have to 0-1 drink a day. Know how much alcohol is in your drink.  In the [place] one drink equals one 12 oz bottle of beer (355 mL), one 5 oz glass of wine (148 mL), or one 1 oz glass of hard liquor (44 mL). Where to find more information Centers for Disease Control and Prevention: FootballExhibition.com.br American Sexual Health Association: www.ashastd.org Office on [employer] Health: http://hoffman.com/ Contact a doctor if: Your symptoms do not get better, even after you are treated. You have more discharge or pain when you pee. You have a fever or chills. You have pain in your belly (abdomen) or in the area between your hips. You have pain with sex. You bleed from your vagina between menstrual periods. Summary This infection can happen when too many germs (bacteria) grow in the vagina. This infection can make it easier to get infections  from sex (STIs). Treating this can lower that chance. Get treated if you are pregnant. This infection can cause babies to be born early. Do not stop taking or using your antibiotic medicine, even if you start to feel better. This information is not intended to replace advice given to you by your health care provider. Make sure you discuss any questions you have with your health care provider. Document Revised: 11/26/2019 Document Reviewed: 11/26/2019 Elsevier Patient Education  2024 Elsevier Inc.    If you have been instructed to have an in-person evaluation today at a local Urgent Care facility, please use the link below. It will take you to a list of all of our available [facility], including address, phone number and hours of operation. Please do not delay care.  [facility]  If you or a family member do not have a primary care provider, use the link below to schedule a visit and establish care. When you choose a [facility] primary care physician or advanced practice provider, you gain a long-term partner in health. Find a Primary Care Provider  Learn more about [facility] in-office and virtual care options: [facility] - Get Care Now

## 2023-05-08 NOTE — Progress Notes (Signed)
Virtual Visit Consent   Gail Robinson, you are scheduled for a virtual visit with a [facility] provider today. Just as with appointments in the office, your consent must be obtained to participate. Your consent will be active for this visit and any virtual visit you may have with one of our providers in the next 365 days. If you have a MyChart account, a copy of this consent can be sent to you electronically.  As this is a virtual visit, video technology does not allow for your provider to perform a traditional examination. This may limit your provider's ability to fully assess your condition. If your provider identifies any concerns that need to be evaluated in person or the need to arrange testing (such as labs, EKG, etc.), we will make arrangements to do so. Although advances in technology are sophisticated, we cannot ensure that it will always work on either your end or our end. If the connection with a video visit is poor, the visit may have to be switched to a telephone visit. With either a video or telephone visit, we are not always able to ensure that we have a secure connection.  By engaging in this virtual visit, you consent to the provision of healthcare and authorize for your insurance to be billed (if applicable) for the services provided during this visit. Depending on your insurance coverage, you may receive a charge related to this service.  I need to obtain your verbal consent now. Are you willing to proceed with your visit today? Gail Robinson has provided verbal consent on 05/08/2023 for a virtual visit (video or telephone). Piedad Climes, [place]  Date: 05/08/2023 8:42 AM  Virtual Visit via Video Note   I, Piedad Climes, connected with  Gail Robinson  ([id number], Aug 30, 1990) on 05/08/23 at  8:30 AM EST by a video-enabled telemedicine application and verified that I am speaking with the correct person using two identifiers.  Location: Patient:  Virtual Visit Location Patient: Home Provider: Virtual Visit Location Provider: Home Office   I discussed the limitations of evaluation and management by telemedicine and the availability of in person appointments. The patient expressed understanding and agreed to proceed.    History of Present Illness: Gail Robinson is a 32 y.o. who identifies as a female who was assigned female at birth, and is being seen today for concern of possible combination of BV and a developing UTI. Notes substantial history of BV so this is not atypical for her, getting it every few months. Notes over the past couple of weeks with foul-smelling discharge. Notes this started after a change in a feminine hygiene production. Denies concern for STI.  Over the past 3-4 days, noting urgency, frequency, suprapubic pressure, hesitancy. Denies fever, chills. Denies vomiting but notes nausea. LMP 1.5 weeks ago. Denies back or belly pain.   HPI: HPI  Problems:  Patient Active Problem List   Diagnosis Date Noted   Post-dates pregnancy 10/03/2021   Heart palpitations 08/07/2021   Bipolar I disorder, most recent episode depressed (HCC) 05/03/2020   Generalized anxiety disorder 05/03/2020   Major depressive disorder, recurrent episode (HCC) 04/06/2020   History of fetal anomaly in prior pregnancy, currently pregnant 05/09/2014    Allergies: No Known Allergies Medications:  Current Outpatient Medications:    cephALEXin (KEFLEX) 500 MG capsule, Take 1 capsule (500 mg total) by mouth 2 (two) times daily for 7 days., Disp: 14 capsule, Rfl: 0   metroNIDAZOLE (FLAGYL) 500 MG tablet,  Take 1 tablet (500 mg total) by mouth 2 (two) times daily., Disp: 14 tablet, Rfl: 0   norethindrone (MICRONOR) 0.35 MG tablet, Take 1 tablet (0.35 mg total) by mouth daily., Disp: 30 tablet, Rfl: 11   sertraline (ZOLOFT) 50 MG tablet, Take 1 tablet (50 mg total) by mouth daily., Disp: 30 tablet, Rfl: 1  Observations/Objective: Patient is  well-developed, well-nourished in no acute distress.  Resting comfortably at home.  Head is normocephalic, atraumatic.  No labored breathing. Speech is clear and coherent with logical content.  Patient is alert and oriented at baseline.   Assessment and Plan: 1. BV (bacterial vaginosis) - metroNIDAZOLE (FLAGYL) 500 MG tablet; Take 1 tablet (500 mg total) by mouth 2 (two) times daily.  Dispense: 14 tablet; Refill: 0  Prior history with classic symptoms over past week or so after change in product. Supportive measures reviewed. Flagyl per orders.  2. Suspected UTI - cephALEXin (KEFLEX) 500 MG capsule; Take 1 capsule (500 mg total) by mouth 2 (two) times daily for 7 days.  Dispense: 14 capsule; Refill: 0  Classic UTI symptoms with absence of alarm signs or symptoms. Suspect secondary to irritation from vaginitis. Prior history of UTI. Will treat empirically with Keflex for suspected uncomplicated cystitis. Supportive measures and OTC medications reviewed. Strict in-person evaluation precautions discussed.    Follow Up Instructions: I discussed the assessment and treatment plan with the patient. The patient was provided an opportunity to ask questions and all were answered. The patient agreed with the plan and demonstrated an understanding of the instructions.  A copy of instructions were sent to the patient via MyChart unless otherwise noted below.   The patient was advised to call back or seek an in-person evaluation if the symptoms worsen or if the condition fails to improve as anticipated.    Piedad Climes, PA-C

## 2023-05-08 NOTE — Progress Notes (Signed)
   Thank you for the details you included in the comment boxes. Those details are very helpful in determining the best course of treatment for you and help [place] to provide the best care. Because of listing symptoms that could be a couple of things, we recommend that you schedule a Virtual Urgent Care video visit in order for the provider to better assess what is going on.  The provider will be able to give you a more accurate diagnosis and treatment plan if we can more freely discuss your symptoms and with the addition of a virtual examination. An in-person exam would be best, but if you are unable to be seen in-person, we could at least try to see if we can get a better idea of what is going on by actually speaking with you.   If you change your visit to a video visit, we will bill your insurance (similar to an office visit) and you will not be charged for this e-Visit. You will be able to stay at home and speak with the first available [facility] advanced practice provider. The link to do a video visit is in the drop down Menu tab of your Welcome screen in MyChart.    I have spent 5 minutes in review of e-visit questionnaire, review and updating patient chart, medical decision making and response to patient.   Margaretann Loveless, PA-C

## 2023-05-28 ENCOUNTER — Ambulatory Visit: Payer: Medicaid Other | Admitting: Family Medicine

## 2023-06-03 ENCOUNTER — Telehealth: Payer: Medicaid Other | Admitting: Physician Assistant

## 2023-06-03 DIAGNOSIS — J208 Acute bronchitis due to other specified organisms: Secondary | ICD-10-CM

## 2023-06-03 DIAGNOSIS — B9689 Other specified bacterial agents as the cause of diseases classified elsewhere: Secondary | ICD-10-CM

## 2023-06-03 MED ORDER — ALBUTEROL SULFATE HFA 108 (90 BASE) MCG/ACT IN AERS: 1.0000 | INHALATION_SPRAY | Freq: Four times a day (QID) | RESPIRATORY_TRACT | 0 refills | Status: AC | PRN

## 2023-06-03 MED ORDER — BENZONATATE 100 MG PO CAPS: 100.0000 mg | ORAL_CAPSULE | Freq: Three times a day (TID) | ORAL | 0 refills | Status: AC | PRN

## 2023-06-03 MED ORDER — AZITHROMYCIN 250 MG PO TABS: ORAL_TABLET | ORAL | 0 refills | Status: AC

## 2023-06-03 NOTE — Progress Notes (Signed)
I have spent 5 minutes in review of e-visit questionnaire, review and updating patient chart, medical decision making and response to patient.   Mia Milan Cody Jacklynn Dehaas, PA-C    

## 2023-06-03 NOTE — Progress Notes (Signed)
 E-Visit for Cough   We are sorry that you are not feeling well.  Here is how we plan to help!  Based on your presentation I believe you most likely have A cough due to bacteria.  When patients have a fever and a productive cough with a change in color or increased sputum production, we are concerned about bacterial bronchitis.  If left untreated it can progress to pneumonia.  If your symptoms do not improve with your treatment plan it is important that you contact your provider.   I have prescribed Azithromyin 250 mg: two tablets now and then one tablet daily for 4 additonal days    In addition you may use A prescription cough medication called Tessalon Perles 100mg . You may take 1-2 capsules every 8 hours as needed for your cough.  I have also sent in an albuterol inhaler to use as directed.  From your responses in the eVisit questionnaire you describe inflammation in the upper respiratory tract which is causing a significant cough.  This is commonly called Bronchitis and has four common causes:   Allergies Viral Infections Acid Reflux Bacterial Infection Allergies, viruses and acid reflux are treated by controlling symptoms or eliminating the cause. An example might be a cough caused by taking certain blood pressure medications. You stop the cough by changing the medication. Another example might be a cough caused by acid reflux. Controlling the reflux helps control the cough.  USE OF BRONCHODILATOR ("RESCUE") INHALERS: There is a risk from using your bronchodilator too frequently.  The risk is that over-reliance on a medication which only relaxes the muscles surrounding the breathing tubes can reduce the effectiveness of medications prescribed to reduce swelling and congestion of the tubes themselves.  Although you feel brief relief from the bronchodilator inhaler, your asthma may actually be worsening with the tubes becoming more swollen and filled with mucus.  This can delay other crucial  treatments, such as oral steroid medications. If you need to use a bronchodilator inhaler daily, several times per day, you should discuss this with your provider.  There are probably better treatments that could be used to keep your asthma under control.     HOME CARE Only take medications as instructed by your medical team. Complete the entire course of an antibiotic. Drink plenty of fluids and get plenty of rest. Avoid close contacts especially the very young and the elderly Cover your mouth if you cough or cough into your sleeve. Always remember to wash your hands A steam or ultrasonic humidifier can help congestion.   GET HELP RIGHT AWAY IF: You develop worsening fever. You become short of breath You cough up blood. Your symptoms persist after you have completed your treatment plan MAKE SURE YOU  Understand these instructions. Will watch your condition. Will get help right away if you are not doing well or get worse.    Thank you for choosing an e-visit.  Your e-visit answers were reviewed by a board certified advanced clinical practitioner to complete your personal care plan. Depending upon the condition, your plan could have included both over the counter or prescription medications.  Please review your pharmacy choice. Make sure the pharmacy is open so you can pick up prescription now. If there is a problem, you may contact your provider through [employer] and have the prescription routed to another pharmacy.  Your safety is important to [place]. If you have drug allergies check your prescription carefully.   For the next 24 hours  you can use MyChart to ask questions about today's visit, request a non-urgent call back, or ask for a work or school excuse. You will get an email in the next two days asking about your experience. I hope that your e-visit has been valuable and will speed your recovery.

## 2023-06-05 IMAGING — US US OB < 14 WEEKS - US OB TV
1 series · 15 of 28 positions shown · non-contrast
Comparison: None.

CLINICAL DATA: 29-year-old pregnant female with vaginal bleeding
and cramping. LMP: 12/06/2020 corresponding to an estimated
gestational age of 8 weeks, 3 days.

EXAM:
OBSTETRIC <14 WK US AND TRANSVAGINAL OB US
TECHNIQUE: Both transabdominal and transvaginal ultrasound examinations were
performed for complete evaluation of the gestation as well as the
maternal uterus, adnexal regions, and pelvic cul-de-sac.
Transvaginal technique was performed to assess early pregnancy.

[Series 1: us ob < 14 weeks - us ob tv · 65 acquisitions, 15 frames shown]
[im 1/65]
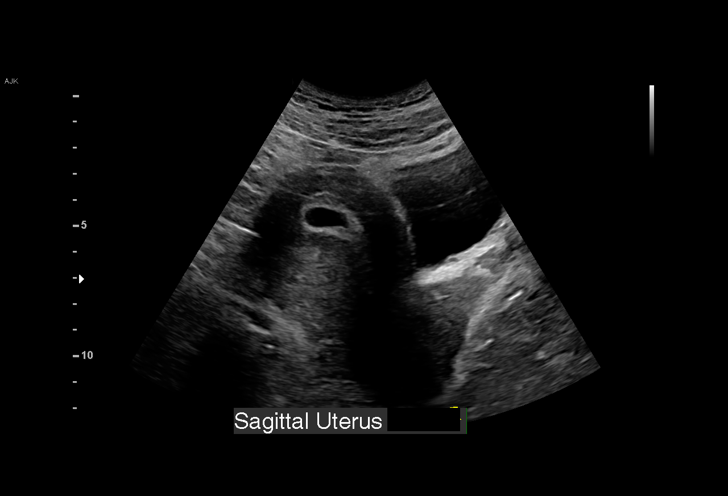
[im 5/65]
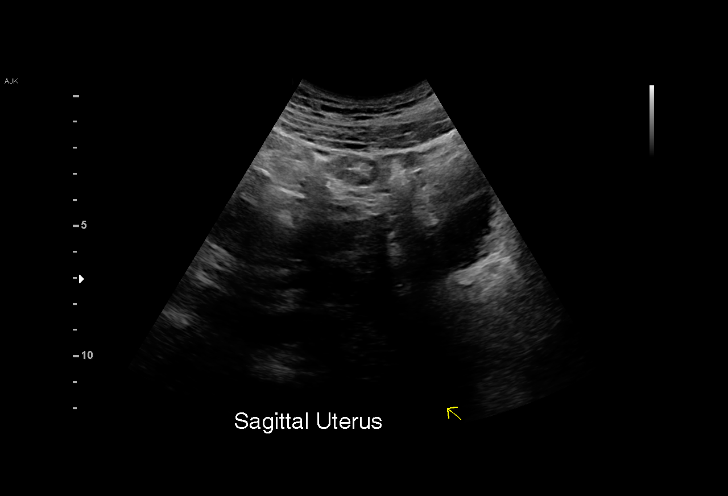
[im 10/65]
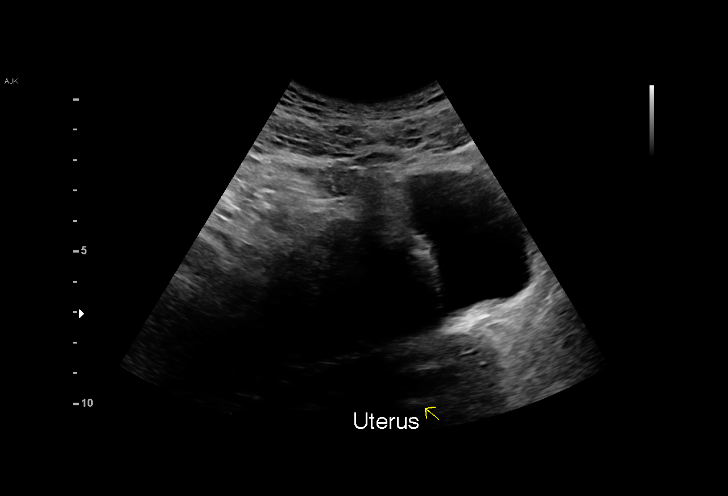
[im 15/65]
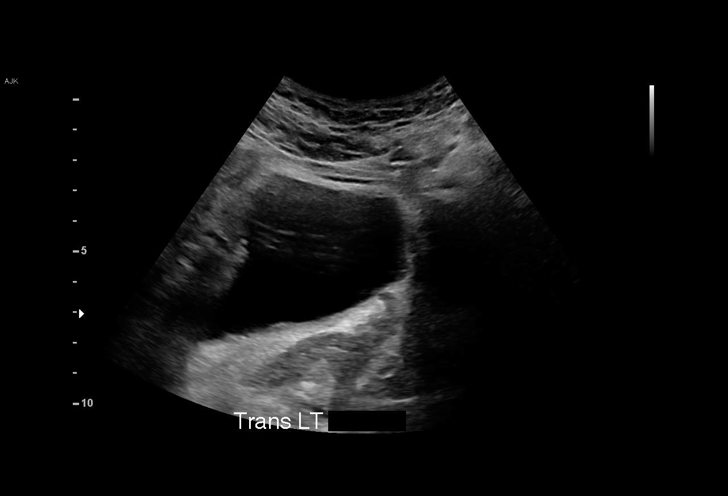
[im 19/65]
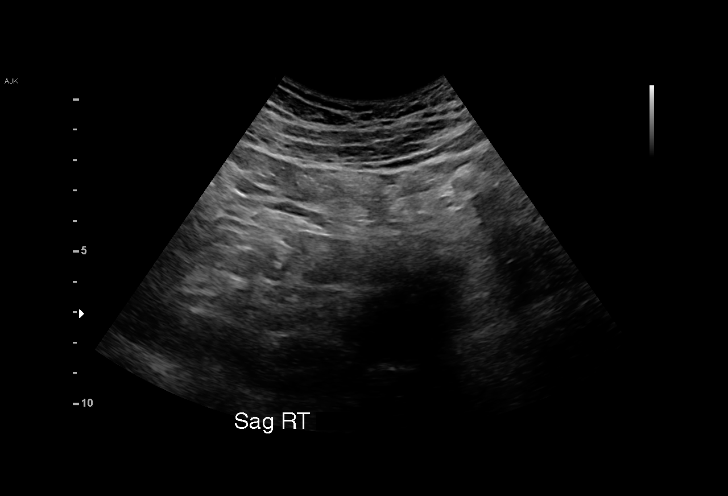
[im 24/65]
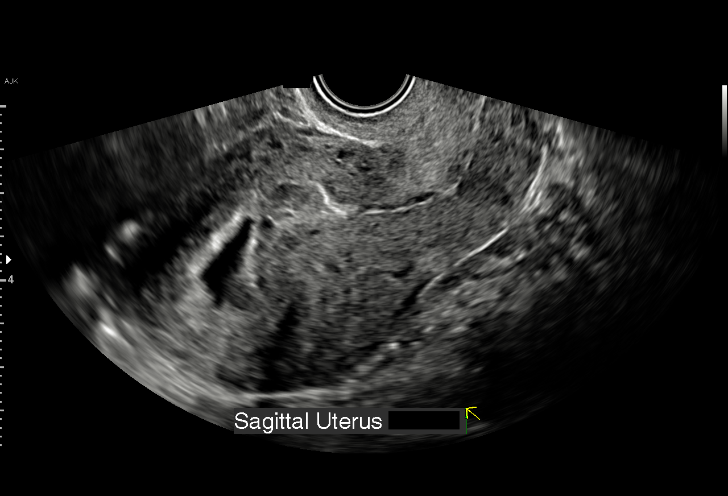
[im 29/65]
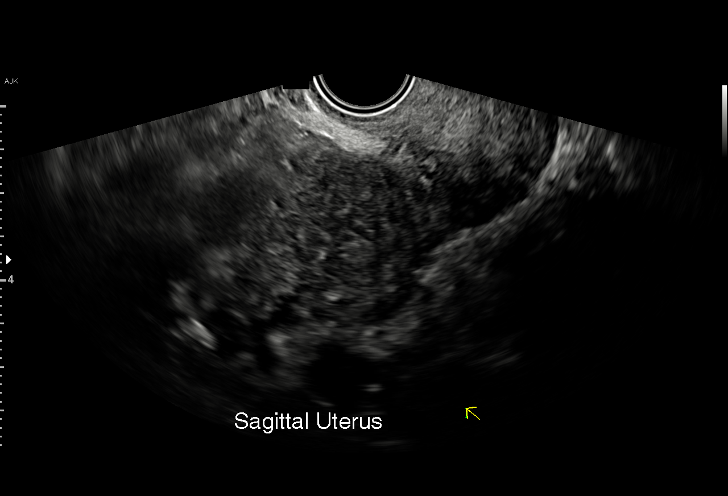
[im 34/65]
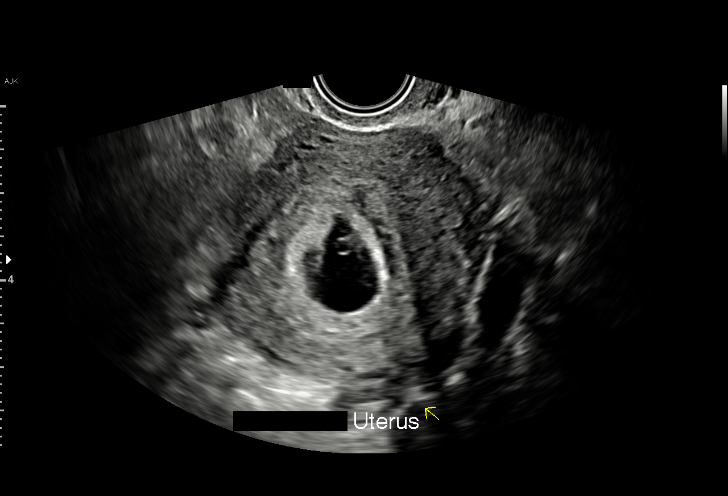
[im 36/65]
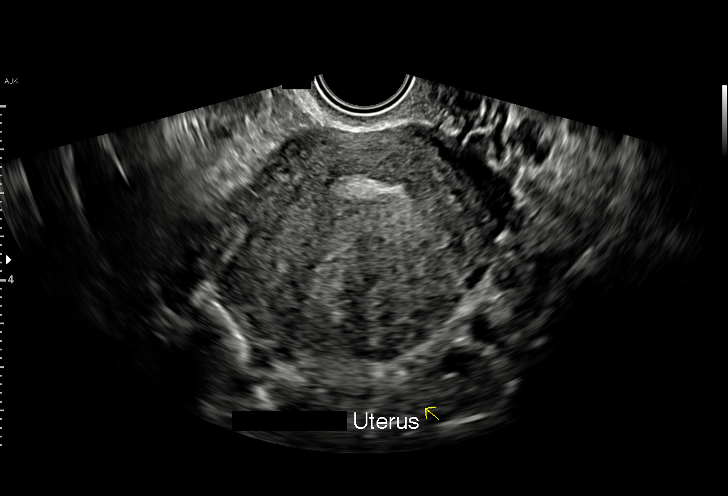
[im 41/65]
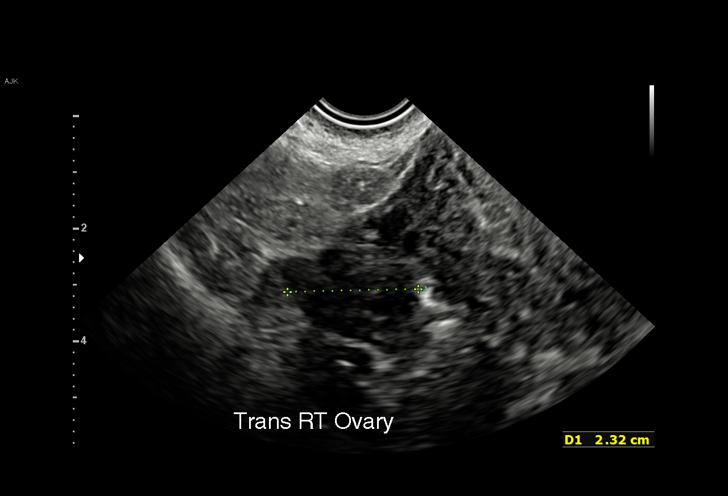
[im 46/65]
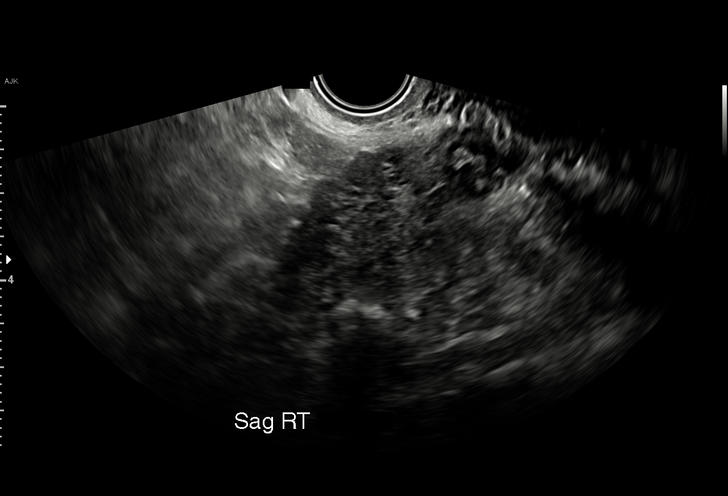
[im 50/65]
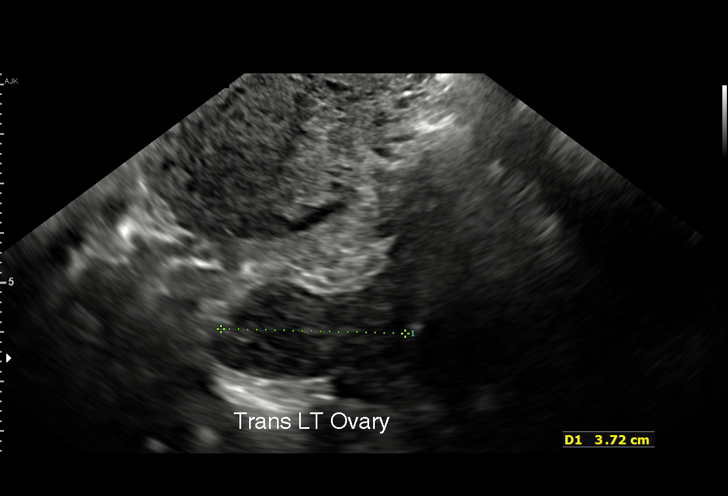
[im 55/65]
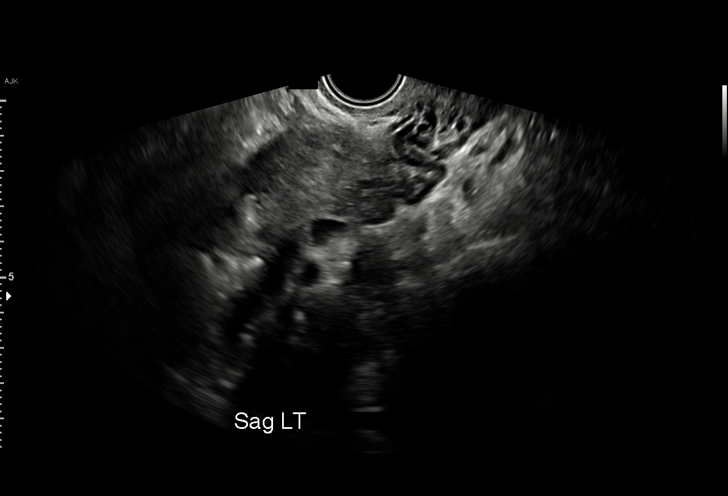
[im 60/65]
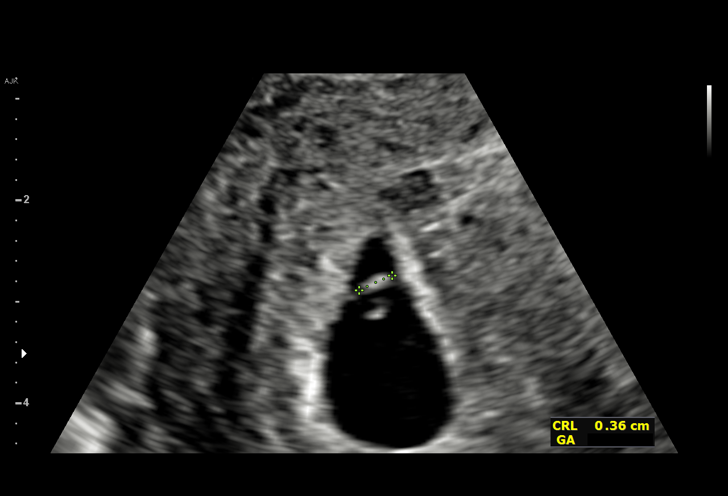
[im 65/65]
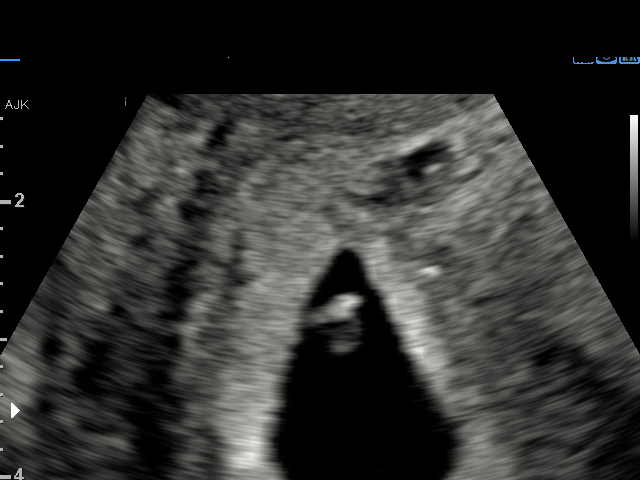

[15 of 28 positions shown; findings below may reference images not displayed]

FINDINGS: Intrauterine gestational sac: Single intrauterine gestational sac.

Yolk sac:  Seen

Embryo:  Present

Cardiac Activity: Detected

Heart Rate: 100 bpm

CRL:  4 mm   6 w   0 d                  US EDC: 09/29/2021

Subchorionic hemorrhage:  None visualized.

Maternal uterus/adnexae: The maternal ovaries are unremarkable.
IMPRESSION: Single live intrauterine pregnancy with an estimated gestational age
of 6 weeks, 0 days based on crown-rump length behind the estimated
gestational age based on LMP.

## 2023-07-30 ENCOUNTER — Telehealth: Payer: Medicaid Other | Admitting: Physician Assistant

## 2023-07-30 DIAGNOSIS — R300 Dysuria: Secondary | ICD-10-CM

## 2023-07-30 DIAGNOSIS — N898 Other specified noninflammatory disorders of vagina: Secondary | ICD-10-CM

## 2023-07-31 NOTE — Progress Notes (Signed)
  Because of belly pain, vaginal discharge, and need to differentiate UTI versus vaginitis versus a combination of both with testing, I feel your condition warrants further evaluation and I recommend that you be seen in a face-to-face visit.   NOTE: There will be NO CHARGE for this E-Visit   If you are having a true medical emergency, please call 911.     For an urgent face to face visit, [facility] has multiple urgent care centers for your convenience.  Click the link below for the full list of locations and hours, walk-in wait times, appointment scheduling options and driving directions:  Urgent Care - [place], [place], [place], [place], [place], [place]  [place]     Your MyChart E-visit questionnaire answers were reviewed by a board certified advanced clinical practitioner to complete your personal care plan based on your specific symptoms.    Thank you for using e-Visits.

## 2024-02-01 IMAGING — US US FETAL BPP W/ NON-STRESS
1 series · 14 of 14 positions shown · non-contrast
Comparison: none

[Series 1: us fetal bpp w/ non-stress · 14 acquisitions, 14 frames shown]
[im 1/14]
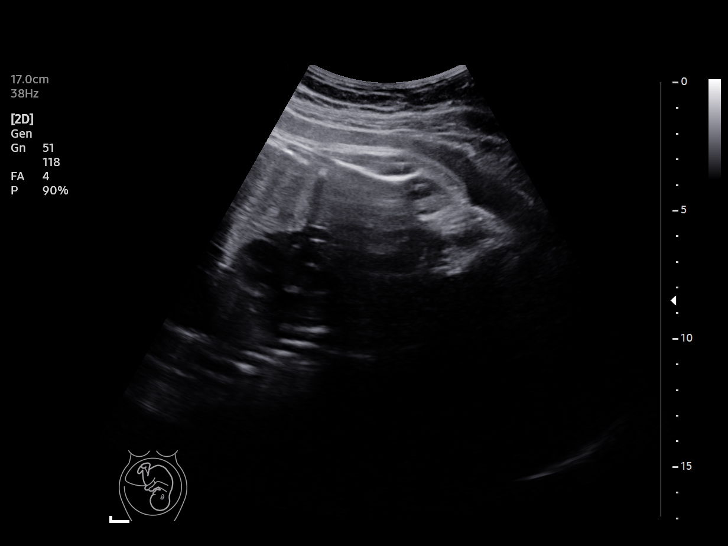
[im 2/14]
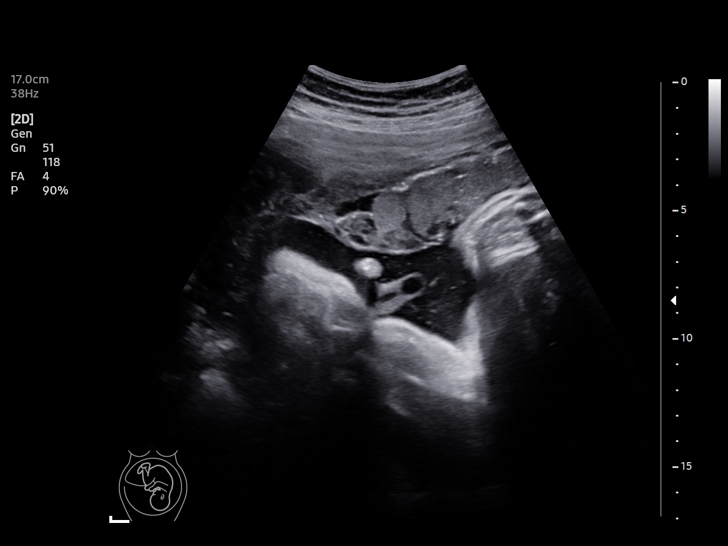
[im 3/14]
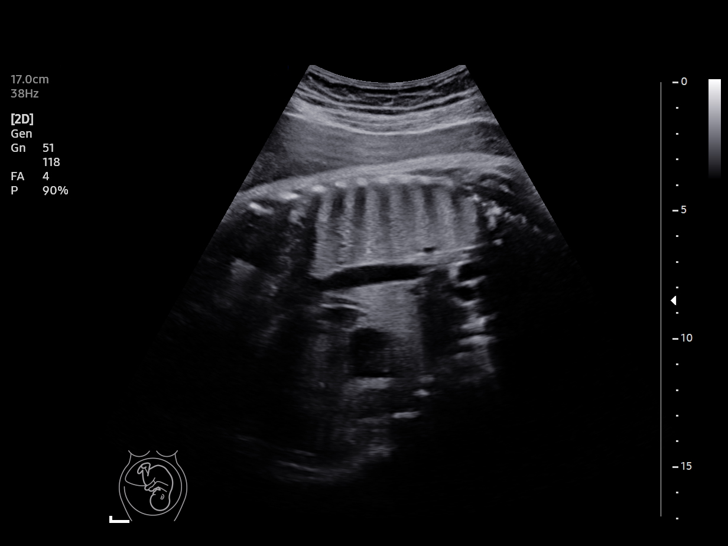
[im 4/14]
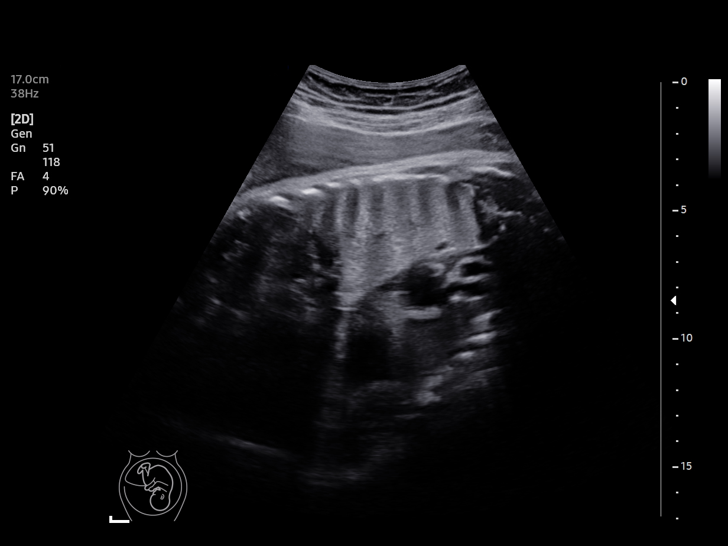
[im 5/14]
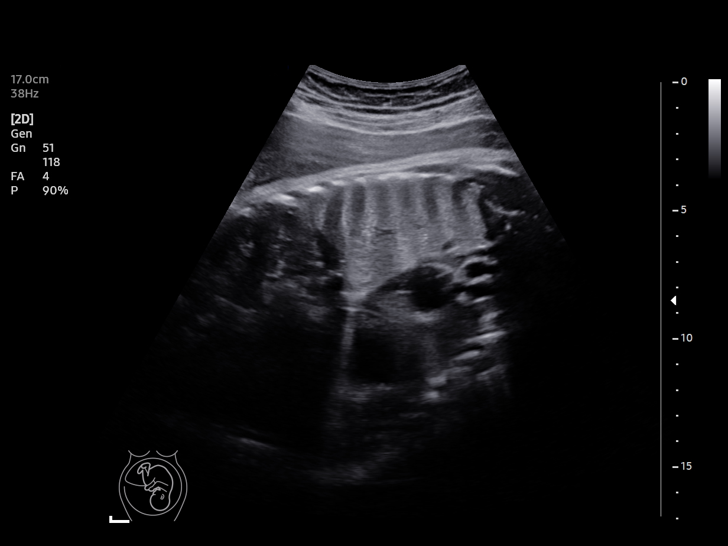
[im 6/14]
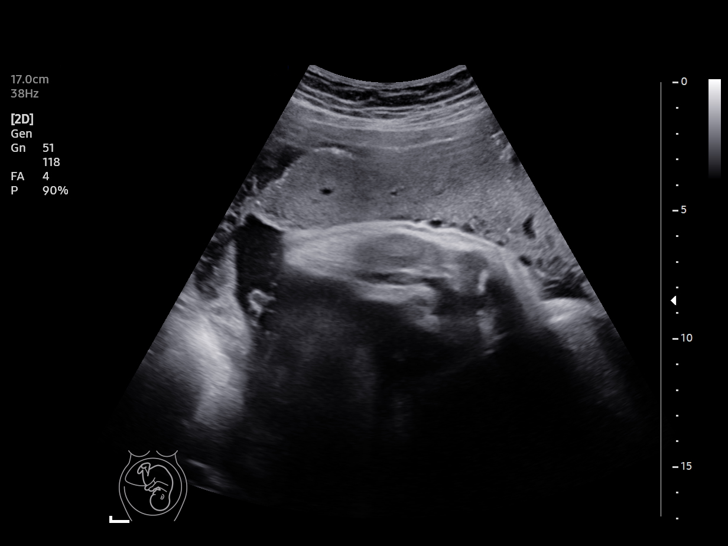
[im 7/14]
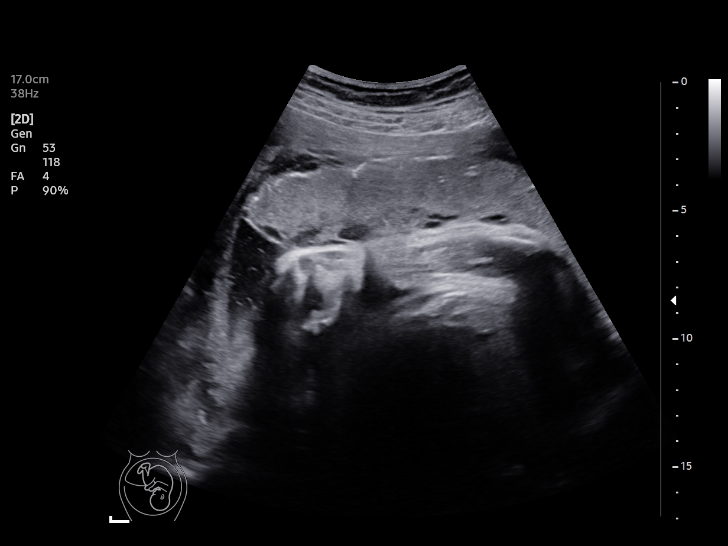
[im 8/14]
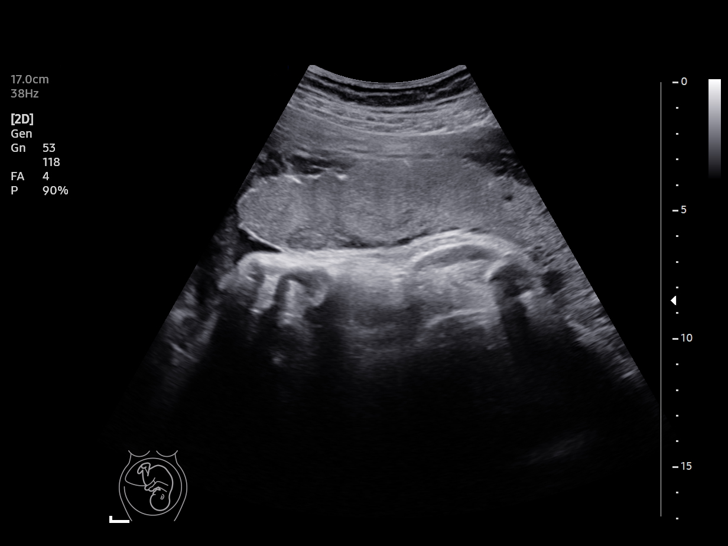
[im 9/14]
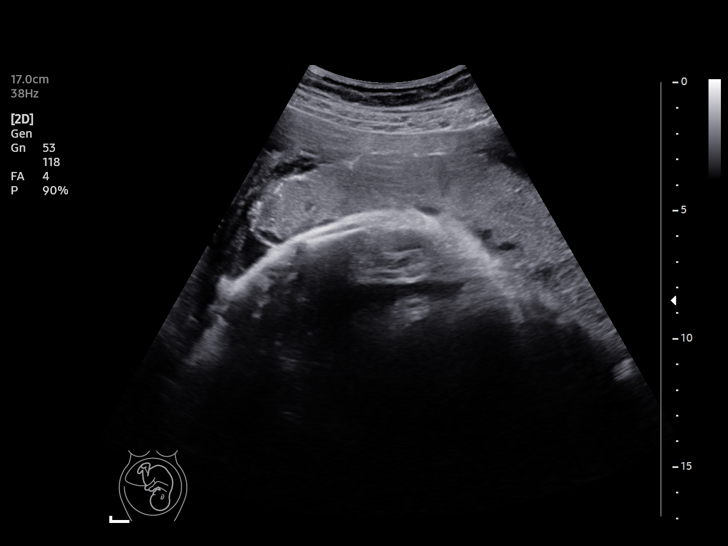
[im 10/14]
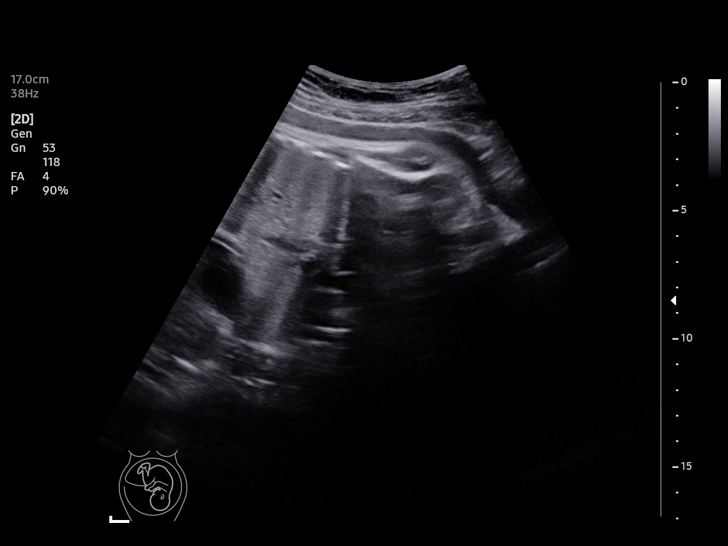
[im 11/14]
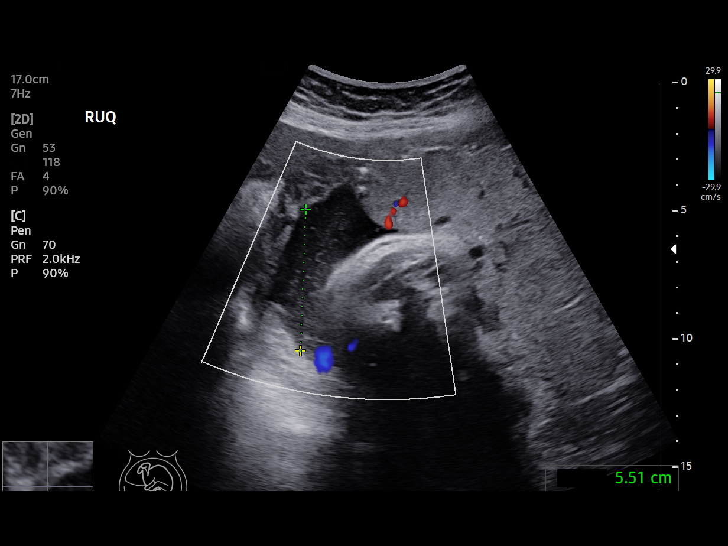
[im 12/14]
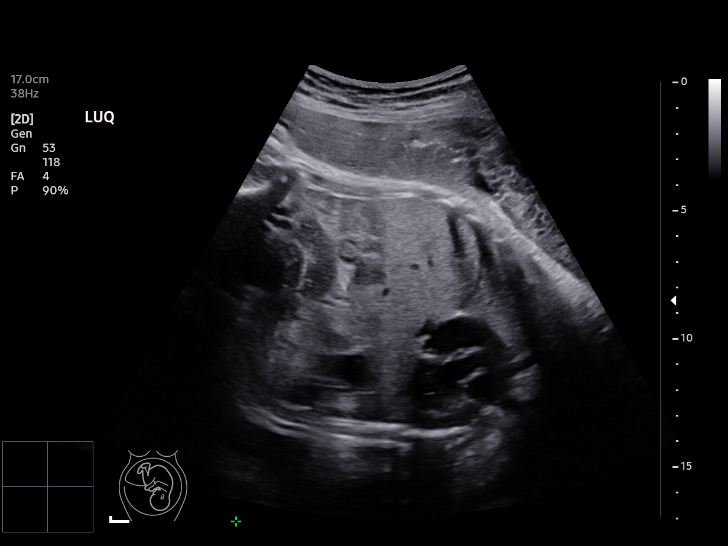
[im 13/14]
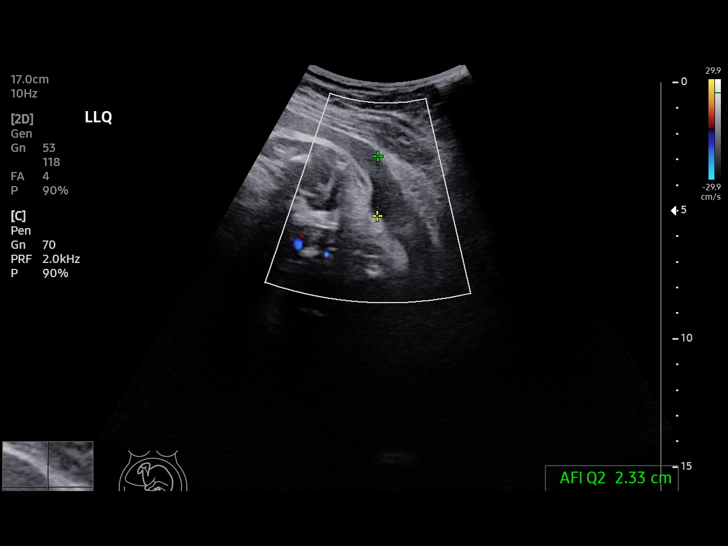
[im 14/14]
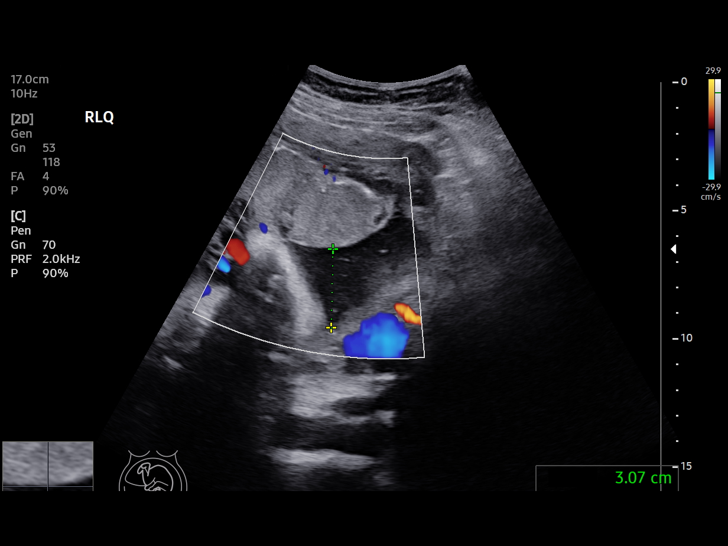

[14 of 14 positions shown; findings below may reference images not displayed]

[REDACTED]care at

 1  US FETAL BPP W/NONSTRESS              76818.4     [facility]

Service(s) Provided

Indications

 40 weeks gestation of pregnancy
 Postdate pregnancy (40-42 weeks)
Fetal Evaluation

 Num Of Fetuses:         1
 Preg. Location:         Intrauterine
 Cardiac Activity:       Observed
 Presentation:           Cephalic

 Amniotic Fluid
 AFI FV:      Within normal limits

 AFI Sum(cm)     %Tile       Largest Pocket(cm)
 10.91           40

 RUQ(cm)                     LUQ(cm)        LLQ(cm)

Biophysical Evaluation

 Amniotic F.V:   Pocket => 2 cm             F. Tone:        Observed
 F. Movement:    Observed                   N.S.T:          Reactive
 F. Breathing:   Observed                   Score:          [DATE]
OB History

 Gravidity:    5         Term:   2         SAB:   2
 Living:       2
Gestational Age

 LMP:           42w 6d        Date:  12/06/20                  EDD:   09/12/21
 Best:          40w 3d     Det. By:  Early Ultrasound         EDD:   09/29/21
                                     (02/03/21)
Impression

 Vertex
Recommendations

 Continue with antenatal testing as clinically indicated
